# Patient Record
Sex: Female | Born: 1970 | Race: White | Hispanic: No | Marital: Married | Smoking: Former smoker
Health system: Southern US, Community
[De-identification: ages and names within clinical notes are randomized; demographics above are authoritative.]

## PROBLEM LIST (undated history)

## (undated) DIAGNOSIS — N289 Disorder of kidney and ureter, unspecified: Secondary | ICD-10-CM

## (undated) HISTORY — PX: NEPHRECTOMY: SHX65

## (undated) HISTORY — PX: TMJ ARTHROPLASTY: SHX1066

---
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
## 1998-03-01 ENCOUNTER — Inpatient Hospital Stay (HOSPITAL_COMMUNITY): Admission: AD | Admit: 1998-03-01 | Discharge: 1998-03-01 | Payer: Self-pay | Admitting: *Deleted

## 1998-03-16 ENCOUNTER — Inpatient Hospital Stay (HOSPITAL_COMMUNITY): Admission: AD | Admit: 1998-03-16 | Discharge: 1998-03-20 | Payer: Self-pay | Admitting: Obstetrics and Gynecology

## 1998-03-17 ENCOUNTER — Encounter: Payer: Self-pay | Admitting: Obstetrics and Gynecology

## 1999-05-21 ENCOUNTER — Ambulatory Visit (HOSPITAL_COMMUNITY): Admission: RE | Admit: 1999-05-21 | Discharge: 1999-05-21 | Payer: Self-pay | Admitting: *Deleted

## 2000-01-11 ENCOUNTER — Other Ambulatory Visit: Admission: RE | Admit: 2000-01-11 | Discharge: 2000-01-11 | Payer: Self-pay | Admitting: *Deleted

## 2001-01-17 ENCOUNTER — Other Ambulatory Visit: Admission: RE | Admit: 2001-01-17 | Discharge: 2001-01-17 | Payer: Self-pay | Admitting: *Deleted

## 2001-05-16 HISTORY — PX: TUBAL LIGATION: SHX77

## 2001-07-20 ENCOUNTER — Inpatient Hospital Stay (HOSPITAL_COMMUNITY): Admission: AD | Admit: 2001-07-20 | Discharge: 2001-07-22 | Payer: Self-pay | Admitting: *Deleted

## 2001-07-23 ENCOUNTER — Encounter (HOSPITAL_COMMUNITY): Admission: RE | Admit: 2001-07-23 | Discharge: 2001-07-26 | Payer: Self-pay | Admitting: Obstetrics and Gynecology

## 2001-07-31 ENCOUNTER — Inpatient Hospital Stay (HOSPITAL_COMMUNITY): Admission: AD | Admit: 2001-07-31 | Discharge: 2001-08-05 | Payer: Self-pay | Admitting: Obstetrics and Gynecology

## 2001-07-31 ENCOUNTER — Encounter (INDEPENDENT_AMBULATORY_CARE_PROVIDER_SITE_OTHER): Payer: Self-pay | Admitting: *Deleted

## 2001-08-10 ENCOUNTER — Encounter: Payer: Self-pay | Admitting: Urology

## 2001-08-10 ENCOUNTER — Encounter: Admission: RE | Admit: 2001-08-10 | Discharge: 2001-08-10 | Payer: Self-pay | Admitting: Urology

## 2001-09-14 ENCOUNTER — Other Ambulatory Visit: Admission: RE | Admit: 2001-09-14 | Discharge: 2001-09-14 | Payer: Self-pay | Admitting: *Deleted

## 2002-02-28 ENCOUNTER — Ambulatory Visit (HOSPITAL_BASED_OUTPATIENT_CLINIC_OR_DEPARTMENT_OTHER): Admission: RE | Admit: 2002-02-28 | Discharge: 2002-02-28 | Payer: Self-pay | Admitting: Urology

## 2002-02-28 ENCOUNTER — Encounter: Payer: Self-pay | Admitting: Urology

## 2002-05-16 HISTORY — PX: NEPHRECTOMY: SHX65

## 2002-11-05 ENCOUNTER — Other Ambulatory Visit: Admission: RE | Admit: 2002-11-05 | Discharge: 2002-11-05 | Payer: Self-pay | Admitting: *Deleted

## 2006-07-26 ENCOUNTER — Encounter: Admission: RE | Admit: 2006-07-26 | Discharge: 2006-07-26 | Payer: Self-pay | Admitting: Obstetrics and Gynecology

## 2007-09-29 ENCOUNTER — Emergency Department (HOSPITAL_COMMUNITY): Admission: EM | Admit: 2007-09-29 | Discharge: 2007-09-29 | Payer: Self-pay | Admitting: Family Medicine

## 2010-07-26 ENCOUNTER — Other Ambulatory Visit: Payer: Self-pay | Admitting: *Deleted

## 2010-07-26 DIAGNOSIS — Z1231 Encounter for screening mammogram for malignant neoplasm of breast: Secondary | ICD-10-CM

## 2010-08-18 ENCOUNTER — Ambulatory Visit
Admission: RE | Admit: 2010-08-18 | Discharge: 2010-08-18 | Disposition: A | Discharge status: Home / Self Care | Payer: BC Managed Care – PPO | Source: Ambulatory Visit | Attending: *Deleted | Admitting: *Deleted

## 2010-08-18 ENCOUNTER — Other Ambulatory Visit: Payer: Self-pay | Admitting: *Deleted

## 2010-08-18 DIAGNOSIS — Z1231 Encounter for screening mammogram for malignant neoplasm of breast: Secondary | ICD-10-CM

## 2010-10-01 NOTE — Op Note (Signed)
[facility]  Patient:    Donna Glass                       MRN: [id number] Proc. Date: 05/21/99 Adm. Date:  [id number] Attending:  Donne Hazel                           Operative Report  PREOPERATIVE DIAGNOSIS:       Missed abortion.  POSTOPERATIVE DIAGNOSIS:      Missed abortion.  OPERATION:                    D&E.  SURGEON:                      R. Alan Mulder, M.D.  ASSISTANT:  ANESTHESIA:                   Local MAC with local supplementation.  ESTIMATED BLOOD LOSS:         50 cc.  COMPLICATIONS:                None.  FINDINGS:                     Products of conception.  DESCRIPTION OF PROCEDURE:     The patient was taken to the operating room where MAC anesthetic was administered.  The patient was placed on the operating table in he dorsal lithotomy position.  The perineum and vagina were prepped and draped in he usual sterile fashion with Betadine and sterile drapes.  A speculum was placed nd the anterior lip of the cervix was grasped with a single tooth tenaculum.  The  and 9 oclock positions were then insufflated with 10 cc of 1% Xylocaine for a paracervical block.  The cervix was then serially dilated until a #21 Pratt dilator fit easily into the intracervical os.  Next, a #7 curved suction curet was introduced into the intrauterine cavity and with multiple passes, the uterus was since systematically emptied in the usual fashion.  Several passes were made throughout the intrauterine cavity with a moderate amount of products of conception aspirated.  The suction curet was removed and the uterus was sharply curetted with a small serrated curet.  It was noted to be empty after two passes with the small serrated curet.  Two final passes were then made with the suction curet with minimal blood and tissue aspirated.  All vaginal instruments were removed.  The  patient was awakened and taken to the recovery room in good  condition. Blood type was A positive.  There were no perioperative complications. DD:  05/21/99 TD:  05/22/99 Job: [id number]

## 2010-10-01 NOTE — Op Note (Signed)
[facility]  Patient:    Donna Glass, Donna Glass Visit Number: [id number] MRN: [id number]          Service Type: REC Location: [facility] Attending Physician:  Marcelle Overlie Dictated by:   Marcelle Overlie, M.D. Proc. Date: 08/02/01 Admit Date:  07/23/2001 Discharge Date: 07/26/2001                             Operative Report  PREOPERATIVE DIAGNOSES:       1. Intrauterine pregnancy at 38 1/7 weeks.                               2. Pyelonephritis.                               3. Failed vacuum.                               4. Arrest of descent.                               5. Desires permanent sterilization.  POSTOPERATIVE DIAGNOSES:      1. intrauterine pregnancy at 38 1/7 weeks.                               2. Pyelonephritis.                               3. Failed vacuum.                               4. Arrest of descent.                               5. Desires permanent sterilization.  PROCEDURE:                    Primary low transverse cesarean section and bilateral tubal ligation.  SURGEON:                      Marcelle Overlie, M.D.  ANESTHESIA:                   Epidural.  ESTIMATED BLOOD LOSS:         500 cc.  FINDINGS:                     Female infant.  Apgars 8 at one minute, 9 at five minutes.  Weight 7 pounds 7 ounces.  COMPLICATIONS:                None.  PROCEDURE:                    Patient was taken to the operating room.  Her epidural was dosed and found to be adequate.  She was then prepped and draped in the usual sterile fashion.  A Foley catheter was already in the bladder. Using a scalpel a low transverse incision was made, carried down to the  fascia.  Using good hemostasis the fascia was scored in the midline and extended laterally.  A Pfannenstiel incision was then created and developed. The peritoneum was entered bluntly and then the peritoneal incision was then stretched.  A bladder blade was inserted.  The lower  uterine segment was identified.  The bladder flap was created sharply and then digitally and the bladder blade was readjusted.  Using a scalpel a low transverse incision was made in the uterus and extended laterally.  The baby was in cephalic presentation.  Was a female infant and delivered easily weighing 7 pounds 7 ounces.  Apgars 8 at one minute and 9 at five minutes.  Cord was clamped and cut and the baby was handed to the waiting pediatricians.  The placenta was manually removed and noted to be intact and normal.  The uterus was cleared of all clots and debris.  The uterine incision was closed in a single layer using 0 chromic in a continuous running locked stitch.  There was some oozing on the right corner which was hemostatic after a figure-of-eight using 0 chromic suture.  Attention was then turned to the fallopian tubes where bilateral fimbriated ends were identified and a modified Pomeroy bilateral tubal ligation was performed by grasping the mid portion of the tube with the Babcock clamp, tying it off using two sutures using 0 plain gut suture, and cutting the tied off tube with Metzenbaum scissors.  Each tubal segment was sent to pathology for analysis.  The uterus was returned to the abdomen.  All of the tubal segments as well as the uterine incision were inspected and noted to be hemostatic.  Irrigation was performed.  The peritoneum was closed in a single layer using 0 Vicryl in a continuous running stitch.  The fascia was closed using 0 Vicryl in a continuous running stitch starting in each corner and meeting in the midline.  After inspection of the subcutaneous layer the skin was closed with staples.  All sponge, lap, and instrument counts were correct x2.  Patient tolerated procedure well and went to recovery room in stable condition. Dictated by:   Marcelle Overlie, M.D. Attending Physician:  Marcelle Overlie DD:  08/02/01 TD:  08/03/01 Job: [id number]

## 2010-10-01 NOTE — Discharge Summary (Signed)
[facility]  Patient:    Donna Glass, Donna Glass Visit Number: [id number] MRN: [id number]          Service Type: Attending:  Trevor Iha, M.D. Dictated by:   Julio Sicks, N.P. Adm. Date:  07/20/01 Disc. Date: 07/22/01                             Discharge Summary  ADMITTING DIAGNOSIS:             Intrauterine pregnancy at 36 weeks with                                  pyelonephritis.  DISCHARGE DIAGNOSIS:             Intrauterine pregnancy at 36 weeks with                                  resolving pyelonephritis.  REASON FOR ADMISSION:            Please see written H&P.  HOSPITAL COURSE:                 The patient presents to the [facility] with intrauterine pregnancy at 71 weeks estimated gestational age with increasing complaints of low back pain and low-grade fever.  The patient had known urinary tract infection with staphylococcus and pseudomonas on urine culture which was treated with Keflex.  The patient states that she has not slept well for several days due to low back pain.  Fetal heart tones were in the 140s.  Vital signs were stable with temperature noted to be 98.6. Urinalysis did reveal positive leukocytes.  Urine was sent for culture and the patient was admitted for IV antibiotic therapy.  On the following morning, the patient had some improvement in low back pain.  Labs revealed hemoglobin of 9.6, hematocrit of 28.1, and WBC count of 12.6.  IV antibiotics were continued.  Fetal heart tones remained in the 130s.  On the following morning right flank pain was resolved, vital signs were stable, fetal heart tones were in the 140s with accelerations noted.  Urine culture revealed pseudomonas with sensitivities to Rocephin.  The patient was discharged home with daily Rocephin injections.  The patient was to return to the triage for the next 4 days to continue antibiotic therapy.  CONDITION ON DISCHARGE:           Good.  DIET:                            Regular as tolerated.  ACTIVITY:                        Up as tolerated.  FOLLOWUP:                        The patient is to follow up in the office in 1 week for prenatal visit.  DISCHARGE INSTRUCTIONS:          She is to call for temperature greater than 100.5 degrees.  DISCHARGE MEDICATIONS:           1. Prenatal vitamins one p.o. daily.  2. Rocephin 1 g IM x4.                                  3. Ferrous sulfate 325 one p.o. daily.                                  4. Darvocet-N 100 one to two every 4-6 hours                                     p.r.n. pain. Dictated by:   Julio Sicks, N.P. Attending:  Trevor Iha, M.D. DD:  10/18/01 TD:  10/20/01 Job: [id number]

## 2010-10-01 NOTE — Discharge Summary (Signed)
[facility]  Patient:    Donna Glass, Donna Glass Visit Number: [id number] MRN: [id number]          Service Type: OBS Location: 910A [id number] [id number] Attending Physician:  Trevor Iha Dictated by:   Julio Sicks, N.P. Admit Date:  07/31/2001 Discharge Date: 08/05/2001                             Discharge Summary  ADMITTING DIAGNOSES:          1. Intrauterine pregnancy at [redacted] weeks                                  gestation.                               2. Pyelonephritis.                               3. Early labor.  DISCHARGE DIAGNOSES:          1. Low transverse cesarean section of viable                                  female infant.                               2. Failed vacuum delivery.                               3. Arrest of descent.                               4. Desires sterilization.  PROCEDURE:                    1. Primary low transverse cesarean section.                               2. Bilateral tubal ligation.  REASON FOR ADMISSION:         Please see dictated H&P.  HOSPITAL COURSE:              The patient was admitted on July 31, 2001 with history of pyelonephritis in early labor. She had been previously hospitalized on March 7 for pyelonephritis and treated with IV antibiotics. She returned to the hospital in early labor with complaints of bilateral flank pain. IV antibiotics were initiated. The patient progressed along a normal labor curve to complete dilation. She pushed for one and a half hours. Vacuum extractor was applied at the +3 station. No gain was noted and patient was prepped for a low transverse cesarean section. Permanent sterilization was requested. A low transverse cesarean section was performed without complication. A viable female infant weighing 7 pounds 7 ounces with Apgars 8 at one minute and 9 at five minutes was delivered.  On postoperative day #1, the patient had normal return of bowel function.  She also complained of some incisional pain and slight left CVA tenderness. She was tolerating  a regular diet without complaints of nausea and vomiting. Continued IV antibiotics were infusing.  On postoperative day #2, the patient was afebrile, ambulating without difficulty and tolerating a regular diet. Hemoglobin was 7.4, hematocrit 21.8, and WBCs 15.5.  On postoperative day #3, the patient was stable with a hemoglobin of 7.4. She continued to be afebrile. IV antibiotics were continued.  On postoperative day #4, the patient was afebrile, IV antibiotics were discontinued and the patient was discharged home with infant.  CONDITION ON DISCHARGE:       Good.  DIET:                         Regular as tolerated.  ACTIVITY:                     No heavy lifting, no driving x two weeks, no vaginal entry.  DISCHARGE FOLLOWUP:           The patient is to follow up in the office in one to two weeks for an incision check. She is to call for temperature greater than 100 degrees, persistent nausea and vomiting, heavy vaginal bleeding, and/or redness or drainage from the incision site.  DISCHARGE MEDICATIONS:        1. Iron one p.o. q.d.                               2. Prenatal vitamins.                               3. Tylox as directed. Dictated by:   Julio Sicks, N.P. Attending Physician:  Trevor Iha DD:  08/31/01 TD:  09/01/01 Job: [id number]

## 2011-09-09 ENCOUNTER — Other Ambulatory Visit: Payer: Self-pay | Admitting: Obstetrics and Gynecology

## 2011-09-09 DIAGNOSIS — Z1231 Encounter for screening mammogram for malignant neoplasm of breast: Secondary | ICD-10-CM

## 2011-10-05 ENCOUNTER — Ambulatory Visit
Admission: RE | Admit: 2011-10-05 | Discharge: 2011-10-05 | Disposition: A | Discharge status: Home / Self Care | Payer: BC Managed Care – PPO | Source: Ambulatory Visit | Attending: Obstetrics and Gynecology | Admitting: Obstetrics and Gynecology

## 2011-10-05 DIAGNOSIS — Z1231 Encounter for screening mammogram for malignant neoplasm of breast: Secondary | ICD-10-CM

## 2013-06-25 ENCOUNTER — Other Ambulatory Visit: Payer: Self-pay

## 2013-06-25 DIAGNOSIS — Z1231 Encounter for screening mammogram for malignant neoplasm of breast: Secondary | ICD-10-CM

## 2013-06-26 ENCOUNTER — Ambulatory Visit
Admission: RE | Admit: 2013-06-26 | Discharge: 2013-06-26 | Disposition: A | Discharge status: Home / Self Care | Payer: BC Managed Care – PPO | Source: Ambulatory Visit

## 2013-06-26 DIAGNOSIS — Z1231 Encounter for screening mammogram for malignant neoplasm of breast: Secondary | ICD-10-CM

## 2014-08-12 ENCOUNTER — Other Ambulatory Visit: Payer: Self-pay

## 2014-08-12 DIAGNOSIS — Z1231 Encounter for screening mammogram for malignant neoplasm of breast: Secondary | ICD-10-CM

## 2014-08-22 ENCOUNTER — Ambulatory Visit
Admission: RE | Admit: 2014-08-22 | Discharge: 2014-08-22 | Disposition: A | Discharge status: Home / Self Care | Payer: BLUE CROSS/BLUE SHIELD | Source: Ambulatory Visit

## 2014-08-22 ENCOUNTER — Encounter (INDEPENDENT_AMBULATORY_CARE_PROVIDER_SITE_OTHER): Payer: Self-pay

## 2014-08-22 DIAGNOSIS — Z1231 Encounter for screening mammogram for malignant neoplasm of breast: Secondary | ICD-10-CM

## 2015-08-24 ENCOUNTER — Other Ambulatory Visit: Payer: Self-pay

## 2015-08-24 DIAGNOSIS — Z1231 Encounter for screening mammogram for malignant neoplasm of breast: Secondary | ICD-10-CM

## 2015-09-14 ENCOUNTER — Ambulatory Visit
Admission: RE | Admit: 2015-09-14 | Discharge: 2015-09-14 | Disposition: A | Discharge status: Home / Self Care | Payer: BLUE CROSS/BLUE SHIELD | Source: Ambulatory Visit

## 2015-09-14 DIAGNOSIS — Z1231 Encounter for screening mammogram for malignant neoplasm of breast: Secondary | ICD-10-CM

## 2016-09-06 ENCOUNTER — Other Ambulatory Visit: Payer: Self-pay | Admitting: Obstetrics and Gynecology

## 2016-09-06 DIAGNOSIS — Z1231 Encounter for screening mammogram for malignant neoplasm of breast: Secondary | ICD-10-CM

## 2016-10-04 ENCOUNTER — Ambulatory Visit
Admission: RE | Admit: 2016-10-04 | Discharge: 2016-10-04 | Disposition: A | Discharge status: Home / Self Care | Payer: BLUE CROSS/BLUE SHIELD | Source: Ambulatory Visit | Attending: Obstetrics and Gynecology | Admitting: Obstetrics and Gynecology

## 2016-10-04 DIAGNOSIS — Z1231 Encounter for screening mammogram for malignant neoplasm of breast: Secondary | ICD-10-CM

## 2017-11-17 ENCOUNTER — Other Ambulatory Visit: Payer: Self-pay | Admitting: Obstetrics and Gynecology

## 2017-11-17 DIAGNOSIS — Z1231 Encounter for screening mammogram for malignant neoplasm of breast: Secondary | ICD-10-CM

## 2017-12-08 ENCOUNTER — Ambulatory Visit
Admission: RE | Admit: 2017-12-08 | Discharge: 2017-12-08 | Disposition: A | Discharge status: Home / Self Care | Payer: BLUE CROSS/BLUE SHIELD | Source: Ambulatory Visit | Attending: Obstetrics and Gynecology | Admitting: Obstetrics and Gynecology

## 2017-12-08 DIAGNOSIS — Z1231 Encounter for screening mammogram for malignant neoplasm of breast: Secondary | ICD-10-CM

## 2018-10-08 ENCOUNTER — Inpatient Hospital Stay (HOSPITAL_COMMUNITY)
Admission: EM | Admit: 2018-10-08 | Discharge: 2018-10-09 | DRG: 419 | Disposition: A | Discharge status: Home / Self Care | Payer: BLUE CROSS/BLUE SHIELD | Attending: General Surgery | Admitting: General Surgery

## 2018-10-08 ENCOUNTER — Encounter (HOSPITAL_COMMUNITY): Payer: Self-pay

## 2018-10-08 ENCOUNTER — Emergency Department (HOSPITAL_COMMUNITY): Payer: BLUE CROSS/BLUE SHIELD

## 2018-10-08 ENCOUNTER — Other Ambulatory Visit: Payer: Self-pay

## 2018-10-08 DIAGNOSIS — Z7982 Long term (current) use of aspirin: Secondary | ICD-10-CM | POA: Diagnosis not present

## 2018-10-08 DIAGNOSIS — Z79890 Hormone replacement therapy (postmenopausal): Secondary | ICD-10-CM | POA: Diagnosis not present

## 2018-10-08 DIAGNOSIS — Z87891 Personal history of nicotine dependence: Secondary | ICD-10-CM

## 2018-10-08 DIAGNOSIS — Z1159 Encounter for screening for other viral diseases: Secondary | ICD-10-CM

## 2018-10-08 DIAGNOSIS — K8000 Calculus of gallbladder with acute cholecystitis without obstruction: Principal | ICD-10-CM | POA: Diagnosis present

## 2018-10-08 DIAGNOSIS — Z79899 Other long term (current) drug therapy: Secondary | ICD-10-CM | POA: Diagnosis not present

## 2018-10-08 DIAGNOSIS — Z888 Allergy status to other drugs, medicaments and biological substances status: Secondary | ICD-10-CM

## 2018-10-08 DIAGNOSIS — Z882 Allergy status to sulfonamides status: Secondary | ICD-10-CM

## 2018-10-08 DIAGNOSIS — Z885 Allergy status to narcotic agent status: Secondary | ICD-10-CM

## 2018-10-08 DIAGNOSIS — Z905 Acquired absence of kidney: Secondary | ICD-10-CM | POA: Diagnosis not present

## 2018-10-08 DIAGNOSIS — K819 Cholecystitis, unspecified: Secondary | ICD-10-CM

## 2018-10-08 DIAGNOSIS — Z880 Allergy status to penicillin: Secondary | ICD-10-CM

## 2018-10-08 DIAGNOSIS — R079 Chest pain, unspecified: Secondary | ICD-10-CM | POA: Diagnosis present

## 2018-10-08 HISTORY — DX: Cholecystitis, unspecified: K81.9

## 2018-10-08 LAB — CBC WITH DIFFERENTIAL/PLATELET
Abs Immature Granulocytes: 0.06 10*3/uL (ref 0.00–0.07)
Basophils Absolute: 0.1 10*3/uL (ref 0.0–0.1)
Basophils Relative: 1 %
Eosinophils Absolute: 0.1 10*3/uL (ref 0.0–0.5)
Eosinophils Relative: 1 %
HCT: 41.3 % (ref 36.0–46.0)
Hemoglobin: 13.5 g/dL (ref 12.0–15.0)
Immature Granulocytes: 0 %
Lymphocytes Relative: 13 %
Lymphs Abs: 2.2 10*3/uL (ref 0.7–4.0)
MCH: 31 pg (ref 26.0–34.0)
MCHC: 32.7 g/dL (ref 30.0–36.0)
MCV: 94.7 fL (ref 80.0–100.0)
Monocytes Absolute: 0.6 10*3/uL (ref 0.1–1.0)
Monocytes Relative: 4 %
Neutro Abs: 13.6 10*3/uL — ABNORMAL HIGH (ref 1.7–7.7)
Neutrophils Relative %: 81 %
Platelets: 310 10*3/uL (ref 150–400)
RBC: 4.36 MIL/uL (ref 3.87–5.11)
RDW: 12.7 % (ref 11.5–15.5)
WBC: 16.6 10*3/uL — ABNORMAL HIGH (ref 4.0–10.5)
nRBC: 0 % (ref 0.0–0.2)

## 2018-10-08 LAB — COMPREHENSIVE METABOLIC PANEL
ALT: 22 U/L (ref 0–44)
AST: 26 U/L (ref 15–41)
Albumin: 4.3 g/dL (ref 3.5–5.0)
Alkaline Phosphatase: 101 U/L (ref 38–126)
Anion gap: 16 — ABNORMAL HIGH (ref 5–15)
BUN: 17 mg/dL (ref 6–20)
CO2: 18 mmol/L — ABNORMAL LOW (ref 22–32)
Calcium: 9.8 mg/dL (ref 8.9–10.3)
Chloride: 107 mmol/L (ref 98–111)
Creatinine, Ser: 1.08 mg/dL — ABNORMAL HIGH (ref 0.44–1.00)
GFR calc Af Amer: >60 mL/min (ref >60)
GFR calc non Af Amer: >60 mL/min (ref >60)
Glucose, Bld: 108 mg/dL — ABNORMAL HIGH (ref 70–99)
Potassium: 4 mmol/L (ref 3.5–5.1)
Sodium: 141 mmol/L (ref 135–145)
Total Bilirubin: 0.4 mg/dL (ref 0.3–1.2)
Total Protein: 7.7 g/dL (ref 6.5–8.1)

## 2018-10-08 LAB — SURGICAL PCR SCREEN
MRSA, PCR: NEGATIVE
Staphylococcus aureus: NEGATIVE

## 2018-10-08 LAB — D-DIMER, QUANTITATIVE (NOT AT ARMC): D-Dimer, Quant: 0.33 ug/mL-FEU (ref 0.00–0.50)

## 2018-10-08 LAB — TROPONIN I: Troponin I: <0.03 ng/mL (ref <0.03)

## 2018-10-08 LAB — SARS CORONAVIRUS 2 BY RT PCR (HOSPITAL ORDER, PERFORMED IN ~~LOC~~ HOSPITAL LAB): SARS Coronavirus 2: NEGATIVE

## 2018-10-08 LAB — LIPASE, BLOOD: Lipase: 27 U/L (ref 11–51)

## 2018-10-08 MED ORDER — DIPHENHYDRAMINE HCL 50 MG/ML IJ SOLN: 25.0000 mg | Freq: Four times a day (QID) | INTRAMUSCULAR | Status: DC | PRN

## 2018-10-08 MED ORDER — ACETAMINOPHEN 650 MG RE SUPP: 650.0000 mg | Freq: Four times a day (QID) | RECTAL | Status: DC | PRN

## 2018-10-08 MED ORDER — DIPHENHYDRAMINE HCL 25 MG PO CAPS: 25.0000 mg | ORAL_CAPSULE | Freq: Four times a day (QID) | ORAL | Status: DC | PRN

## 2018-10-08 MED ORDER — ENOXAPARIN SODIUM 40 MG/0.4ML ~~LOC~~ SOLN
40.0000 mg | SUBCUTANEOUS | Status: DC
  Administered 2018-10-08: 40 mg via SUBCUTANEOUS
  Filled 2018-10-08: qty 0.4

## 2018-10-08 MED ORDER — MORPHINE SULFATE (PF) 4 MG/ML IV SOLN
INTRAVENOUS | Status: AC
  Filled 2018-10-08: qty 1

## 2018-10-08 MED ORDER — HYDROMORPHONE HCL 1 MG/ML IJ SOLN
1.0000 mg | Freq: Once | INTRAMUSCULAR | Status: AC
  Administered 2018-10-08: 1 mg via INTRAVENOUS
  Filled 2018-10-08: qty 1

## 2018-10-08 MED ORDER — ALUM & MAG HYDROXIDE-SIMETH 200-200-20 MG/5ML PO SUSP: 30.0000 mL | Freq: Once | ORAL | Status: DC

## 2018-10-08 MED ORDER — ONDANSETRON HCL 4 MG/2ML IJ SOLN: 4.0000 mg | Freq: Four times a day (QID) | INTRAMUSCULAR | Status: DC | PRN

## 2018-10-08 MED ORDER — CIPROFLOXACIN IN D5W 400 MG/200ML IV SOLN
400.0000 mg | Freq: Once | INTRAVENOUS | Status: AC
  Administered 2018-10-08: 400 mg via INTRAVENOUS
  Filled 2018-10-08: qty 200

## 2018-10-08 MED ORDER — OXYCODONE HCL 5 MG PO TABS
5.0000 mg | ORAL_TABLET | ORAL | Status: DC | PRN
  Administered 2018-10-09: 5 mg via ORAL
  Filled 2018-10-08: qty 1

## 2018-10-08 MED ORDER — CIPROFLOXACIN IN D5W 400 MG/200ML IV SOLN
400.0000 mg | Freq: Two times a day (BID) | INTRAVENOUS | Status: DC
  Administered 2018-10-09: 03:00:00 400 mg via INTRAVENOUS
  Filled 2018-10-08 (×2): qty 200

## 2018-10-08 MED ORDER — LIDOCAINE VISCOUS HCL 2 % MT SOLN: 15.0000 mL | Freq: Once | OROMUCOSAL | Status: DC

## 2018-10-08 MED ORDER — ACETAMINOPHEN 325 MG PO TABS
650.0000 mg | ORAL_TABLET | Freq: Four times a day (QID) | ORAL | Status: DC | PRN
  Administered 2018-10-09 (×2): 650 mg via ORAL
  Filled 2018-10-08 (×2): qty 2

## 2018-10-08 MED ORDER — MORPHINE SULFATE (PF) 2 MG/ML IV SOLN
2.0000 mg | INTRAVENOUS | Status: DC | PRN
  Administered 2018-10-08 – 2018-10-09 (×3): 2 mg via INTRAVENOUS
  Filled 2018-10-08 (×3): qty 1

## 2018-10-08 MED ORDER — ONDANSETRON 4 MG PO TBDP: 4.0000 mg | ORAL_TABLET | Freq: Four times a day (QID) | ORAL | Status: DC | PRN

## 2018-10-08 MED ORDER — DEXTROSE-NACL 5-0.45 % IV SOLN
INTRAVENOUS | Status: DC
  Administered 2018-10-08: 18:00:00 via INTRAVENOUS

## 2018-10-08 MED ORDER — MORPHINE SULFATE (PF) 4 MG/ML IV SOLN
4.0000 mg | Freq: Once | INTRAVENOUS | Status: AC
  Administered 2018-10-08: 13:00:00 4 mg via INTRAVENOUS
  Filled 2018-10-08: qty 1

## 2018-10-08 MED ORDER — FAMOTIDINE IN NACL 20-0.9 MG/50ML-% IV SOLN
20.0000 mg | Freq: Once | INTRAVENOUS | Status: AC
  Administered 2018-10-08: 13:00:00 20 mg via INTRAVENOUS
  Filled 2018-10-08: qty 50

## 2018-10-08 MED ORDER — METOPROLOL TARTRATE 5 MG/5ML IV SOLN: 5.0000 mg | Freq: Four times a day (QID) | INTRAVENOUS | Status: DC | PRN

## 2018-10-08 NOTE — ED Triage Notes (Signed)
Lside CP/ midsternal radiating into mid back, woke her from sleep.  Has improved and worsened without complete resolve.  N/V starting half hour later.  Pt guarding at mid chest with tachypnea.  Alert and oriented.

## 2018-10-08 NOTE — ED Notes (Signed)
ED TO INPATIENT HANDOFF REPORT  ED Nurse Name and Phone #:  Britta Mccreedy   S Name/Age/Gender Donna Glass 48 y.o. female Room/Bed: 030C/030C  Code Status   Code Status: Full Code  Home/SNF/Other Home Patient oriented to: self, place, time and situation Is this baseline? Yes   Triage Complete: Triage complete  Chief Complaint CP  Triage Note Lside CP/ midsternal radiating into mid back, woke her from sleep.  Has improved and worsened without complete resolve.  N/V starting half hour later.  Pt guarding at mid chest with tachypnea.  Alert and oriented.    Allergies Allergies  Allergen Reactions  . Trimethoprim Hives and Rash  . Amoxicillin Hives  . Sulfamethoxazole Hives  . Codeine Palpitations    Level of Care/Admitting Diagnosis ED Disposition    ED Disposition Condition Comment   Admit  Hospital Area: [facility] [[id number]]  Level of Care: Med-Surg [16]  Covid Evaluation: Screening Protocol (No Symptoms)  Diagnosis: Acute calculous cholecystitis [161096]  Admitting Physician: CCS, MD [[id number]]  Attending Physician: CCS, MD [[id number]]  Estimated length of stay: past midnight tomorrow  Certification:: I certify this patient will need inpatient services for at least 2 midnights  PT Class (Do Not Modify): Inpatient [101]  PT Acc Code (Do Not Modify): Private [1]       B Medical/Surgery History No past medical history on file. Past Surgical History:  Procedure Laterality Date  . NEPHRECTOMY       A IV Location/Drains/Wounds Patient Lines/Drains/Airways Status   Active Line/Drains/Airways    Name:   Placement date:   Placement time:   Site:   Days:   Peripheral IV 10/08/18 Right Antecubital   10/08/18    1055    Antecubital   less than 1          Intake/Output Last 24 hours  Intake/Output Summary (Last 24 hours) at 10/08/2018 1619 Last data filed at 10/08/2018 1618 Gross per 24 hour  Intake 350 ml  Output -  Net 350 ml     Labs/Imaging Results for orders placed or performed during the hospital encounter of 10/08/18 (from the past 48 hour(s))  Troponin I - Once     Status: None   Collection Time: 10/08/18 11:05 AM  Result Value Ref Range   Troponin I <0.03 <0.03 ng/mL    Comment: Performed at [facility], [address] [place], [place] [address]  Comprehensive metabolic panel     Status: Abnormal   Collection Time: 10/08/18 11:05 AM  Result Value Ref Range   Sodium 141 135 - 145 mmol/L   Potassium 4.0 3.5 - 5.1 mmol/L   Chloride 107 98 - 111 mmol/L   CO2 18 (L) 22 - 32 mmol/L   Glucose, Bld 108 (H) 70 - 99 mg/dL   BUN 17 6 - 20 mg/dL   Creatinine, Ser 9.81 (H) 0.44 - 1.00 mg/dL   Calcium 9.8 8.9 - 19.1 mg/dL   Total Protein 7.7 6.5 - 8.1 g/dL   Albumin 4.3 3.5 - 5.0 g/dL   AST 26 15 - 41 U/L   ALT 22 0 - 44 U/L   Alkaline Phosphatase 101 38 - 126 U/L   Total Bilirubin 0.4 0.3 - 1.2 mg/dL   GFR calc non Af Amer >60 >60 mL/min   GFR calc Af Amer >60 >60 mL/min   Anion gap 16 (H) 5 - 15    Comment: Performed at [facility], [address] [place], [place] [address]  CBC with Differential     Status: Abnormal   Collection Time: 10/08/18 11:05 AM  Result Value Ref Range   WBC 16.6 (H) 4.0 - 10.5 K/uL   RBC 4.36 3.87 - 5.11 MIL/uL   Hemoglobin 13.5 12.0 - 15.0 g/dL   HCT 60.4 54.0 - 98.1 %   MCV 94.7 80.0 - 100.0 fL   MCH 31.0 26.0 - 34.0 pg   MCHC 32.7 30.0 - 36.0 g/dL   RDW 19.1 47.8 - 29.5 %   Platelets 310 150 - 400 K/uL   nRBC 0.0 0.0 - 0.2 %   Neutrophils Relative % 81 %   Neutro Abs 13.6 (H) 1.7 - 7.7 K/uL   Lymphocytes Relative 13 %   Lymphs Abs 2.2 0.7 - 4.0 K/uL   Monocytes Relative 4 %   Monocytes Absolute 0.6 0.1 - 1.0 K/uL   Eosinophils Relative 1 %   Eosinophils Absolute 0.1 0.0 - 0.5 K/uL   Basophils Relative 1 %   Basophils Absolute 0.1 0.0 - 0.1 K/uL   Immature Granulocytes 0 %   Abs Immature Granulocytes 0.06 0.00 - 0.07 K/uL    Comment: Performed  at [facility], [address], [place], [place] [address]  D-dimer, quantitative     Status: None   Collection Time: 10/08/18 11:05 AM  Result Value Ref Range   D-Dimer, Quant 0.33 0.00 - 0.50 ug/mL-FEU    Comment: (NOTE) At the manufacturer cut-off of 0.50 ug/mL FEU, this assay has been documented to exclude PE with a sensitivity and negative predictive value of 97 to 99%.  At this time, this assay has not been approved by the FDA to exclude DVT/VTE. Results should be correlated with clinical presentation. Performed at [facility], [address], [place], [place] [address]   Lipase, blood     Status: None   Collection Time: 10/08/18 12:30 PM  Result Value Ref Range   Lipase 27 11 - 51 U/L    Comment: Performed at [facility], [address] [place], [place] [address]  SARS Coronavirus 2 (CEPHEID - Performed in [facility] hospital lab), Hosp Order     Status: None   Collection Time: 10/08/18  3:01 PM  Result Value Ref Range   SARS Coronavirus 2 NEGATIVE NEGATIVE    Comment: (NOTE) If result is NEGATIVE SARS-CoV-2 target nucleic acids are NOT DETECTED. The SARS-CoV-2 RNA is generally detectable in upper and lower  respiratory specimens during the acute phase of infection. The lowest  concentration of SARS-CoV-2 viral copies this assay can detect is 250  copies / mL. A negative result does not preclude SARS-CoV-2 infection  and should not be used as the sole basis for treatment or other  patient management decisions.  A negative result may occur with  improper specimen collection / handling, submission of specimen other  than nasopharyngeal swab, presence of viral mutation(s) within the  areas targeted by this assay, and inadequate number of viral copies  (<250 copies / mL). A negative result must be combined with clinical  observations, patient history, and epidemiological information. If result is POSITIVE SARS-CoV-2 target nucleic acids are  DETECTED. The SARS-CoV-2 RNA is generally detectable in upper and lower  respiratory specimens dur ing the acute phase of infection.  Positive  results are indicative of active infection with SARS-CoV-2.  Clinical  correlation with patient history and other diagnostic information is  necessary to  determine patient infection status.  Positive results do  not rule out bacterial infection or co-infection with other viruses. If result is PRESUMPTIVE POSTIVE SARS-CoV-2 nucleic acids MAY BE PRESENT.   A presumptive positive result was obtained on the submitted specimen  and confirmed on repeat testing.  While 2019 novel coronavirus  (SARS-CoV-2) nucleic acids may be present in the submitted sample  additional confirmatory testing may be necessary for epidemiological  and / or clinical management purposes  to differentiate between  SARS-CoV-2 and other Sarbecovirus currently known to infect humans.  If clinically indicated additional testing with an alternate test  methodology [phone] is advised. The SARS-CoV-2 RNA is generally  detectable in upper and lower respiratory sp ecimens during the acute  phase of infection. The expected result is Negative. Fact Sheet for Patients:  BoilerBrush.com.cyhttps://www.fda.gov/media/136312/download Fact Sheet for Healthcare Providers: https://pope.com/https://www.fda.gov/media/136313/download This test is not yet approved or cleared by the [place] FDA and has been authorized for detection and/or diagnosis of SARS-CoV-2 by FDA under an Emergency Use Authorization (EUA).  This EUA will remain in effect (meaning this test can be used) for the duration of the COVID-19 declaration under Section 564(b)(1) of the Act, 21 U.S.C. section 360bbb-3(b)(1), unless the authorization is terminated or revoked sooner. Performed at [facility], [address] [place], [place] [address]    Dg Chest 2 View  Result Date: 10/08/2018 CLINICAL DATA:  Epigastric pain, vomiting. EXAM: CHEST -  2 VIEW COMPARISON:  Chest x-ray dated 03/20/2015. FINDINGS: The heart size and mediastinal contours are within normal limits. Both lungs are clear. The visualized skeletal structures are unremarkable. IMPRESSION: No active cardiopulmonary disease. Electronically Signed   By: Bary RichardStan  Maynard M.D.   On: 10/08/2018 12:21   [place] Abdomen Limited  Result Date: 10/08/2018 CLINICAL DATA:  Chest pain radiating to the back EXAM: ULTRASOUND ABDOMEN LIMITED RIGHT UPPER QUADRANT COMPARISON:  None. FINDINGS: Gallbladder: Cholelithiasis with the largest gallstone measuring 13 mm. Gallbladder sludge. Gallbladder wall thickening measuring 9 mm. Trace pericholecystic fluid. Positive sonographic Murphy sign. Common bile duct: Diameter: 4.6 mm Liver: No focal lesion identified. Increased hepatic parenchymal echogenicity. Portal vein is patent on color Doppler imaging with normal direction of blood flow towards the liver. IMPRESSION: Cholelithiasis with findings most concerning for acute cholecystitis. Electronically Signed   By: Elige KoHetal  Patel   On: 10/08/2018 12:21    Pending Labs Unresulted Labs (From admission, onward)    Start     Ordered   10/15/18 0500  Creatinine, serum  (enoxaparin (LOVENOX)    CrCl >/= 30 ml/min)  Weekly,   R    Comments:  while on enoxaparin therapy    10/08/18 1545   10/09/18 0500  Comprehensive metabolic panel  Daily,   R     10/08/18 1545   10/09/18 0500  CBC  Daily,   R     10/08/18 1545   10/08/18 1544  CBC  (enoxaparin (LOVENOX)    CrCl >/= 30 ml/min)  Once,   R    Comments:  Baseline for enoxaparin therapy IF NOT ALREADY DRAWN.  Notify MD if PLT < 100 K.    10/08/18 1545   10/08/18 1544  Creatinine, serum  (enoxaparin (LOVENOX)    CrCl >/= 30 ml/min)  Once,   R    Comments:  Baseline for enoxaparin therapy IF NOT ALREADY DRAWN.    10/08/18 1545   10/08/18 1544  HIV antibody (Routine Testing)  Once,   R  10/08/18 1545          Vitals/Pain Today's Vitals   10/08/18 1315  10/08/18 1330 10/08/18 1345 10/08/18 1400  BP: 133/80 121/78 119/76 (!) 121/100  Pulse: 83  84 83  Resp: 12 20 11 18   Temp:      TempSrc:      SpO2: 100%  100% 100%  Weight:      Height:      PainSc:        Isolation Precautions No active isolations  Medications Medications  morphine 4 MG/ML injection (has no administration in time range)  alum & mag hydroxide-simeth (MAALOX/MYLANTA) 200-200-20 MG/5ML suspension 30 mL (30 mLs Oral Not Given 10/08/18 1445)    And  lidocaine (XYLOCAINE) 2 % viscous mouth solution 15 mL (15 mLs Oral Not Given 10/08/18 1446)  enoxaparin (LOVENOX) injection 40 mg (has no administration in time range)  dextrose 5 %-0.45 % sodium chloride infusion (has no administration in time range)  acetaminophen (TYLENOL) tablet 650 mg (has no administration in time range)    Or  acetaminophen (TYLENOL) suppository 650 mg (has no administration in time range)  oxyCODONE (Oxy IR/ROXICODONE) immediate release tablet 5 mg (has no administration in time range)  morphine 2 MG/ML injection 2 mg (has no administration in time range)  ondansetron (ZOFRAN-ODT) disintegrating tablet 4 mg (has no administration in time range)    Or  ondansetron (ZOFRAN) injection 4 mg (has no administration in time range)  metoprolol tartrate (LOPRESSOR) injection 5 mg (has no administration in time range)  ciprofloxacin (CIPRO) IVPB 400 mg (has no administration in time range)  diphenhydrAMINE (BENADRYL) capsule 25 mg (has no administration in time range)    Or  diphenhydrAMINE (BENADRYL) injection 25 mg (has no administration in time range)  famotidine (PEPCID) IVPB 20 mg premix (0 mg Intravenous Stopped 10/08/18 1327)  morphine 4 MG/ML injection 4 mg (4 mg Intravenous Given 10/08/18 1302)  ciprofloxacin (CIPRO) IVPB 400 mg (0 mg Intravenous Stopped 10/08/18 1618)  HYDROmorphone (DILAUDID) injection 1 mg (1 mg Intravenous Given 10/08/18 1457)    Mobility walks Low fall risk   Focused  Assessments Cardiac Assessment Handoff:  Cardiac Rhythm: Normal sinus rhythm Lab Results  Component Value Date   TROPONINI <0.03 10/08/2018   Lab Results  Component Value Date   DDIMER 0.33 10/08/2018   Does the Patient currently have chest pain? No     R Recommendations: See Admitting Provider Note  Report given to:   Additional Notes:

## 2018-10-08 NOTE — H&P (Signed)
Reason for Consult: epigastric pain Referring Physician: Josi Roediger is an 48 y.o. female.  HPI: 48 yo female with 1 day of epigastric pain. She woke up with the pain. It is intense. It radiates to her back. She has never had a pain like this before. She has been vomiting multiple times today. She denies diarrhea or constipation. She denies odd foods yesterday.  No past medical history on file.  Past Surgical History:  Procedure Laterality Date  . NEPHRECTOMY      Family History  Problem Relation Age of Onset  . Breast cancer Neg Hx     Social History:  reports that she has quit smoking. She has never used smokeless tobacco. She reports current alcohol use. She reports that she does not use drugs.  Allergies:  Allergies  Allergen Reactions  . Trimethoprim Hives and Rash  . Amoxicillin Hives  . Sulfamethoxazole Hives  . Codeine Palpitations    Medications: I have reviewed the patient's current medications.  Results for orders placed or performed during the hospital encounter of 10/08/18 (from the past 48 hour(s))  Troponin I - Once     Status: None   Collection Time: 10/08/18 11:05 AM  Result Value Ref Range   Troponin I <0.03 <0.03 ng/mL    Comment: Performed at [facility], [address] [place], [place] [address]  Comprehensive metabolic panel     Status: Abnormal   Collection Time: 10/08/18 11:05 AM  Result Value Ref Range   Sodium 141 135 - 145 mmol/L   Potassium 4.0 3.5 - 5.1 mmol/L   Chloride 107 98 - 111 mmol/L   CO2 18 (L) 22 - 32 mmol/L   Glucose, Bld 108 (H) 70 - 99 mg/dL   BUN 17 6 - 20 mg/dL   Creatinine, Ser 1.47 (H) 0.44 - 1.00 mg/dL   Calcium 9.8 8.9 - 82.9 mg/dL   Total Protein 7.7 6.5 - 8.1 g/dL   Albumin 4.3 3.5 - 5.0 g/dL   AST 26 15 - 41 U/L   ALT 22 0 - 44 U/L   Alkaline Phosphatase 101 38 - 126 U/L   Total Bilirubin 0.4 0.3 - 1.2 mg/dL   GFR calc non Af Amer >60 >60 mL/min   GFR calc Af Amer >60 >60 mL/min    Anion gap 16 (H) 5 - 15    Comment: Performed at [facility], [address] [place], [place] [address]  CBC with Differential     Status: Abnormal   Collection Time: 10/08/18 11:05 AM  Result Value Ref Range   WBC 16.6 (H) 4.0 - 10.5 K/uL   RBC 4.36 3.87 - 5.11 MIL/uL   Hemoglobin 13.5 12.0 - 15.0 g/dL   HCT 08.6 57.8 - 46.9 %   MCV 94.7 80.0 - 100.0 fL   MCH 31.0 26.0 - 34.0 pg   MCHC 32.7 30.0 - 36.0 g/dL   RDW 62.9 52.8 - 41.3 %   Platelets 310 150 - 400 K/uL   nRBC 0.0 0.0 - 0.2 %   Neutrophils Relative % 81 %   Neutro Abs 13.6 (H) 1.7 - 7.7 K/uL   Lymphocytes Relative 13 %   Lymphs Abs 2.2 0.7 - 4.0 K/uL   Monocytes Relative 4 %   Monocytes Absolute 0.6 0.1 - 1.0 K/uL   Eosinophils Relative 1 %   Eosinophils Absolute 0.1 0.0 - 0.5 K/uL   Basophils Relative 1 %  Basophils Absolute 0.1 0.0 - 0.1 K/uL   Immature Granulocytes 0 %   Abs Immature Granulocytes 0.06 0.00 - 0.07 K/uL    Comment: Performed at [facility], [address] [place], [place] [address]  D-dimer, quantitative     Status: None   Collection Time: 10/08/18 11:05 AM  Result Value Ref Range   D-Dimer, Quant 0.33 0.00 - 0.50 ug/mL-FEU    Comment: (NOTE) At the manufacturer cut-off of 0.50 ug/mL FEU, this assay has been documented to exclude PE with a sensitivity and negative predictive value of 97 to 99%.  At this time, this assay has not been approved by the FDA to exclude DVT/VTE. Results should be correlated with clinical presentation. Performed at [facility], [address] [place], [place] [address]   Lipase, blood     Status: None   Collection Time: 10/08/18 12:30 PM  Result Value Ref Range   Lipase 27 11 - 51 U/L    Comment: Performed at [facility], [address] [place], [place] [address]    Dg Chest 2 View  Result Date: 10/08/2018 CLINICAL DATA:  Epigastric pain, vomiting. EXAM: CHEST - 2 VIEW COMPARISON:  Chest x-ray dated 03/20/2015. FINDINGS: The  heart size and mediastinal contours are within normal limits. Both lungs are clear. The visualized skeletal structures are unremarkable. IMPRESSION: No active cardiopulmonary disease. Electronically Signed   By: Bary RichardStan  Maynard M.D.   On: 10/08/2018 12:21   [place] Abdomen Limited  Result Date: 10/08/2018 CLINICAL DATA:  Chest pain radiating to the back EXAM: ULTRASOUND ABDOMEN LIMITED RIGHT UPPER QUADRANT COMPARISON:  None. FINDINGS: Gallbladder: Cholelithiasis with the largest gallstone measuring 13 mm. Gallbladder sludge. Gallbladder wall thickening measuring 9 mm. Trace pericholecystic fluid. Positive sonographic Murphy sign. Common bile duct: Diameter: 4.6 mm Liver: No focal lesion identified. Increased hepatic parenchymal echogenicity. Portal vein is patent on color Doppler imaging with normal direction of blood flow towards the liver. IMPRESSION: Cholelithiasis with findings most concerning for acute cholecystitis. Electronically Signed   By: Elige KoHetal  Patel   On: 10/08/2018 12:21    Review of Systems  Constitutional: Negative for chills and fever.  HENT: Negative for hearing loss.   Eyes: Negative for blurred vision and double vision.  Respiratory: Negative for cough and hemoptysis.   Cardiovascular: Negative for chest pain and palpitations.  Gastrointestinal: Positive for abdominal pain, nausea and vomiting.  Genitourinary: Negative for dysuria and urgency.  Musculoskeletal: Negative for myalgias and neck pain.  Skin: Negative for itching and rash.  Neurological: Negative for dizziness, tingling and headaches.  Endo/Heme/Allergies: Does not bruise/bleed easily.  Psychiatric/Behavioral: Negative for depression and suicidal ideas.   Blood pressure 119/76, pulse 84, temperature 98.1 F (36.7 C), temperature source Oral, resp. rate 11, height 5\' 6"  (1.676 m), weight 78.5 kg, last menstrual period 05/27/2013, SpO2 100 %. Physical Exam  Vitals reviewed. Constitutional: She is oriented to person,  place, and time. She appears well-developed and well-nourished.  HENT:  Head: Normocephalic and atraumatic.  Eyes: Pupils are equal, round, and reactive to light. Conjunctivae and EOM are normal.  Neck: Normal range of motion. Neck supple.  Cardiovascular: Normal rate and regular rhythm.  Respiratory: Effort normal and breath sounds normal.  GI: Soft. Bowel sounds are normal. She exhibits no distension. There is abdominal tenderness in the right upper quadrant and epigastric area. There is guarding. There is no rigidity. No hernia. Hernia confirmed negative in the ventral area.  Musculoskeletal: Normal range  of motion.  Neurological: She is alert and oriented to person, place, and time.  Skin: Skin is warm and dry.  Psychiatric: She has a normal mood and affect. Her behavior is normal.      Assessment/Plan: 48 yo female with acute cholecystitis -admit to surgery -IV abx -plan for lap chole in am We discussed the etiology of her pain, we discussed treatment options and recommended surgery. We discussed details of surgery including general anesthesia, laparoscopic approach, identification of cystic duct and common bile duct. Ligation of cystic duct and cystic artery. Possible need for intraoperative cholangiogram or open procedure. Possible risks of common bile duct injury, liver injury, cystic duct leak, bleeding, infection, post-cholecystectomy syndrome. The patient showed good understanding and all questions were answered   [place]anch  10/08/2018, 3:39 PM

## 2018-10-08 NOTE — ED Notes (Signed)
Patient transported to X-ray- ultrasound with monitor.

## 2018-10-08 NOTE — ED Provider Notes (Signed)
[facility] EMERGENCY DEPARTMENT Provider Note   CSN: [id number] Arrival date & time: 10/08/18  1040    History   Chief Complaint No chief complaint on file.   HPI Donna Glass is a 48 y.o. female with history of CKD status post left nephrectomy presents for evaluation of acute onset, constant chest pains beginning at around 6 AM this morning.  She reports that she awoke with severe discomfort to the midline chest radiating straight to the back (though she motions to the epigastric region when describing where her pain originates). She describes it as a severe aching sensation.  It worsens breathing out and certain positions sitting upright and walking improve her pain.  Has had several episodes of nonbloody nonbilious emesis.  She reports mild shortness of breath which she attributes to her discomfort.  Denies fever, cough, syncope, leg swelling, abdominal pain, diarrhea, constipation, or urinary symptoms.  She took an over-the-counter nausea medication and Pepto-Bismol without relief of her symptoms.  No recent travel or surgeries, no leg swelling, no hemoptysis, no prior history of DVT or PE.  She is on hormone replacement therapy.  She is a former smoker, quit December 2019.  Denies excessive alcohol intake or recreational drug use.     The history is provided by the patient.    No past medical history on file.  Patient Active Problem List   Diagnosis Date Noted   Acute calculous cholecystitis 10/08/2018    Past Surgical History:  Procedure Laterality Date   NEPHRECTOMY       OB History   No obstetric history on file.      Home Medications    Prior to Admission medications   Medication Sig Start Date End Date Taking? Authorizing Provider  acetaminophen (TYLENOL) 500 MG tablet Take 1,000 mg by mouth every 6 (six) hours as needed for mild pain.   Yes [provider]  anti-nausea (EMETROL) solution Take 10 mLs by mouth every 15 (fifteen)  minutes as needed for nausea or vomiting.   Yes [provider]  aspirin 325 MG tablet Take 650 mg by mouth once.   Yes [provider]  bismuth subsalicylate (PEPTO BISMOL) 262 MG chewable tablet Chew 524 mg by mouth as needed for indigestion.   Yes [provider]  buPROPion (WELLBUTRIN XL) 300 MG 24 hr tablet Take 300 mg by mouth daily. 07/30/18  Yes [provider]  diphenhydrAMINE (BENADRYL) 25 MG tablet Take 25 mg by mouth as needed for allergies.   Yes [provider]  DUAVEE 0.45-20 MG TABS Take 1 tablet by mouth daily. 09/05/18  Yes [provider]  Phentermine HCl 8 MG TABS Take 8 mg by mouth daily. 08/07/18  Yes [provider]  psyllium (METAMUCIL SMOOTH TEXTURE) 28 % packet Take 1 packet by mouth daily as needed (constipation).   Yes [provider]  topiramate (TOPAMAX) 25 MG tablet Take 25 mg by mouth daily. 09/26/18  Yes [provider]  zolpidem (AMBIEN) 10 MG tablet Take 10 mg by mouth at bedtime as needed for sleep. 09/28/18  Yes [provider]    Family History Family History  Problem Relation Age of Onset   Breast cancer Neg Hx     Social History Social History   Tobacco Use   Smoking status: Former Smoker   Smokeless tobacco: Never Used  Substance Use Topics   Alcohol use: Yes    Comment: Rare use   Drug use: Never  Allergies   Trimethoprim; Amoxicillin; Sulfamethoxazole; and Codeine   Review of Systems Review of Systems  Constitutional: Negative for chills and fever.  Respiratory: Positive for shortness of breath.   Cardiovascular: Positive for chest pain. Negative for leg swelling.  Gastrointestinal: Positive for nausea and vomiting. Negative for abdominal pain, constipation and diarrhea.  Neurological: Positive for light-headedness. Negative for syncope.  All other systems reviewed and are negative.    Physical Exam Updated Vital Signs BP (!) 121/100     Pulse 83    Temp 98.1 F (36.7 C) (Oral)    Resp 18    Ht  (1.676 m)    Wt 78.5 kg    LMP 05/27/2013    SpO2 100%    BMI 27.92 kg/m   Physical Exam Vitals signs and nursing note reviewed.  Constitutional:      General: She is not in acute distress.    Appearance: She is well-developed.     Comments: Appears uncomfortable  HENT:     Head: Normocephalic and atraumatic.  Eyes:     General:        Right eye: No discharge.        Left eye: No discharge.     Conjunctiva/sclera: Conjunctivae normal.  Neck:     Vascular: No JVD.     Trachea: No tracheal deviation.  Cardiovascular:     Rate and Rhythm: Normal rate and regular rhythm.     Pulses: Normal pulses.     Heart sounds: Normal heart sounds.     Comments: 2+ radial and DP/PT pulses bilaterally, Homans sign absent bilaterally, no lower extremity edema, no palpable cords, compartments are soft  Pulmonary:     Effort: Pulmonary effort is normal.     Comments: Tachypneic, speaking in shorter sentences, SPO2 saturations 100% on room air Chest:     Chest wall: No tenderness.  Abdominal:     General: Abdomen is flat. Bowel sounds are normal. There is no distension.     Palpations: Abdomen is soft.     Tenderness: There is abdominal tenderness in the right upper quadrant and epigastric area. There is guarding. Positive signs include Murphy's sign.  Skin:    General: Skin is warm and dry.     Findings: No erythema.  Neurological:     Mental Status: She is alert.  Psychiatric:        Behavior: Behavior normal.      ED Treatments / Results  Labs (all labs ordered are listed, but only abnormal results are displayed) Labs Reviewed  COMPREHENSIVE METABOLIC PANEL - Abnormal; Notable for the following components:      Result Value   CO2 18 (*)    Glucose, Bld 108 (*)    Creatinine, Ser 1.08 (*)    Anion gap 16 (*)    All other components within normal limits  CBC WITH DIFFERENTIAL/PLATELET - Abnormal; Notable for the  following components:   WBC 16.6 (*)    Neutro Abs 13.6 (*)    All other components within normal limits  SARS CORONAVIRUS 2 (HOSPITAL ORDER, PERFORMED IN [facility])  TROPONIN I  D-DIMER, QUANTITATIVE (NOT AT [facility])  LIPASE, BLOOD  CBC  CREATININE, SERUM  HIV ANTIBODY (ROUTINE TESTING W REFLEX)    EKG EKG Interpretation  Date/Time:  Monday Oct 08 2018 10:51:03 EDT Ventricular Rate:  83 PR Interval:    QRS Duration: 84 QT Interval:  408 QTC Calculation: 480 R Axis:  57 Text Interpretation:  Sinus rhythm No previous tracing Confirmed by Cathren LaineSteinl, Kevin [phone]([id number]) on 10/08/2018 10:52:50 AM   Radiology Dg Chest 2 View  Result Date: 10/08/2018 CLINICAL DATA:  Epigastric pain, vomiting. EXAM: CHEST - 2 VIEW COMPARISON:  Chest x-ray dated 03/20/2015. FINDINGS: The heart size and mediastinal contours are within normal limits. Both lungs are clear. The visualized skeletal structures are unremarkable. IMPRESSION: No active cardiopulmonary disease. Electronically Signed   By: Bary RichardStan  Maynard M.D.   On: 10/08/2018 12:21   [place] Abdomen Limited  Result Date: 10/08/2018 CLINICAL DATA:  Chest pain radiating to the back EXAM: ULTRASOUND ABDOMEN LIMITED RIGHT UPPER QUADRANT COMPARISON:  None. FINDINGS: Gallbladder: Cholelithiasis with the largest gallstone measuring 13 mm. Gallbladder sludge. Gallbladder wall thickening measuring 9 mm. Trace pericholecystic fluid. Positive sonographic Murphy sign. Common bile duct: Diameter: 4.6 mm Liver: No focal lesion identified. Increased hepatic parenchymal echogenicity. Portal vein is patent on color Doppler imaging with normal direction of blood flow towards the liver. IMPRESSION: Cholelithiasis with findings most concerning for acute cholecystitis. Electronically Signed   By: Elige KoHetal  Patel   On: 10/08/2018 12:21    Procedures Procedures (including critical care time)  Medications Ordered in ED Medications  morphine 4 MG/ML injection (has no  administration in time range)  alum & mag hydroxide-simeth (MAALOX/MYLANTA) 200-200-20 MG/5ML suspension 30 mL (30 mLs Oral Not Given 10/08/18 1445)    And  lidocaine (XYLOCAINE) 2 % viscous mouth solution 15 mL (15 mLs Oral Not Given 10/08/18 1446)  enoxaparin (LOVENOX) injection 40 mg (has no administration in time range)  dextrose 5 %-0.45 % sodium chloride infusion (has no administration in time range)  acetaminophen (TYLENOL) tablet 650 mg (has no administration in time range)    Or  acetaminophen (TYLENOL) suppository 650 mg (has no administration in time range)  oxyCODONE (Oxy IR/ROXICODONE) immediate release tablet 5 mg (has no administration in time range)  morphine 2 MG/ML injection 2 mg (has no administration in time range)  ondansetron (ZOFRAN-ODT) disintegrating tablet 4 mg (has no administration in time range)    Or  ondansetron (ZOFRAN) injection 4 mg (has no administration in time range)  metoprolol tartrate (LOPRESSOR) injection 5 mg (has no administration in time range)  ciprofloxacin (CIPRO) IVPB 400 mg (has no administration in time range)  diphenhydrAMINE (BENADRYL) capsule 25 mg (has no administration in time range)    Or  diphenhydrAMINE (BENADRYL) injection 25 mg (has no administration in time range)  famotidine (PEPCID) IVPB 20 mg premix (0 mg Intravenous Stopped 10/08/18 1327)  morphine 4 MG/ML injection 4 mg (4 mg Intravenous Given 10/08/18 1302)  ciprofloxacin (CIPRO) IVPB 400 mg (0 mg Intravenous Stopped 10/08/18 1618)  HYDROmorphone (DILAUDID) injection 1 mg (1 mg Intravenous Given 10/08/18 1457)     Initial Impression / Assessment and Plan / ED Course  I have reviewed the triage vital signs and the nursing notes.  Pertinent labs & imaging results that were available during my care of the patient were reviewed by me and considered in my medical decision making (see chart for details).        Patient presenting for evaluation of severe epigastric abdominal  pain radiating to the midline chest and back beginning this morning with associated nausea and vomiting.  She is afebrile, vital signs are stable.  She is quite uncomfortable but nontoxic in appearance.  She does have right upper quadrant tenderness and positive Murphy sign on exam.  Will obtain lab work, chest x-ray, right upper  quadrant ultrasound, EKG, give pain control and reassess.   EKG shows normal sinus rhythm, no acute ischemic abnormalities noted.  Initial troponin is negative.  D-dimer is also negative.  Remainder of labs reviewed by me significant for leukocytosis of 16.6 with elevation in absolute neutrophil count.  Mildly elevated creatinine but BUN is within normal limits.  LFTs are within normal limits.  Lipase is also within normal limits.  However, her right upper quadrant ultrasound is concerning for acute cholecystitis with cholelithiasis, gallbladder sludge, gallbladder wall thickening, trace pericholecystic fluid and positive sonographic Murphy sign.  On reevaluation patient is resting more comfortably, reports pain has improved though she has required multiple doses for adequate pain control.  She has not had any emesis in the ED.  Low concern for obstruction, perforation, appendicitis.  Her cardiopulmonary work-up is negative and I doubt dissection, cardiac tamponade, PE, ACS/MI, pneumonia, CHF, esophageal rupture, or pneumothorax.  2:04PM Spoke with Dr. Sheliah Hatch with general surgery, who will see the patient emergently in the emergency department.  Plan for likely laparoscopic cholecystectomy.  Will give IV antibiotics in the ED.  She was given IV Cipro in the ED due to amoxicillin allergy. Final Clinical Impressions(s) / ED Diagnoses   Final diagnoses:  Calculus of gallbladder with acute cholecystitis without obstruction    ED Discharge Orders    None       Jeanie Sewer, PA-C 10/08/18 1647    Cathren Laine, MD 10/09/18 1424

## 2018-10-08 NOTE — Progress Notes (Signed)
1645 received pt from ED, A&O x4, walked to the bathroom right after arrival to the unit, with steady gait. RUQ pain getting better per pt.

## 2018-10-08 NOTE — ED Notes (Signed)
Sudden onset pain with increased SOB.   New orders given with pain relief and able to rest.

## 2018-10-09 ENCOUNTER — Encounter (HOSPITAL_COMMUNITY): Payer: Self-pay

## 2018-10-09 ENCOUNTER — Inpatient Hospital Stay (HOSPITAL_COMMUNITY): Payer: BLUE CROSS/BLUE SHIELD | Admitting: Anesthesiology

## 2018-10-09 ENCOUNTER — Encounter (HOSPITAL_COMMUNITY): Admission: EM | Disposition: A | Discharge status: Home / Self Care | Payer: Self-pay | Source: Home / Self Care

## 2018-10-09 HISTORY — PX: CHOLECYSTECTOMY: SHX55

## 2018-10-09 LAB — COMPREHENSIVE METABOLIC PANEL
ALT: 28 U/L (ref 0–44)
AST: 24 U/L (ref 15–41)
Albumin: 3.2 g/dL — ABNORMAL LOW (ref 3.5–5.0)
Alkaline Phosphatase: 84 U/L (ref 38–126)
Anion gap: 8 (ref 5–15)
BUN: 10 mg/dL (ref 6–20)
CO2: 22 mmol/L (ref 22–32)
Calcium: 8.6 mg/dL — ABNORMAL LOW (ref 8.9–10.3)
Chloride: 110 mmol/L (ref 98–111)
Creatinine, Ser: 1.05 mg/dL — ABNORMAL HIGH (ref 0.44–1.00)
GFR calc Af Amer: >60 mL/min (ref >60)
GFR calc non Af Amer: >60 mL/min (ref >60)
Glucose, Bld: 147 mg/dL — ABNORMAL HIGH (ref 70–99)
Potassium: 3.5 mmol/L (ref 3.5–5.1)
Sodium: 140 mmol/L (ref 135–145)
Total Bilirubin: 0.6 mg/dL (ref 0.3–1.2)
Total Protein: 5.7 g/dL — ABNORMAL LOW (ref 6.5–8.1)

## 2018-10-09 LAB — CBC
HCT: 35.5 % — ABNORMAL LOW (ref 36.0–46.0)
Hemoglobin: 11.5 g/dL — ABNORMAL LOW (ref 12.0–15.0)
MCH: 31.1 pg (ref 26.0–34.0)
MCHC: 32.4 g/dL (ref 30.0–36.0)
MCV: 95.9 fL (ref 80.0–100.0)
Platelets: 227 10*3/uL (ref 150–400)
RBC: 3.7 MIL/uL — ABNORMAL LOW (ref 3.87–5.11)
RDW: 13 % (ref 11.5–15.5)
WBC: 10.1 10*3/uL (ref 4.0–10.5)
nRBC: 0 % (ref 0.0–0.2)

## 2018-10-09 LAB — HIV ANTIBODY (ROUTINE TESTING W REFLEX): HIV Screen 4th Generation wRfx: NONREACTIVE

## 2018-10-09 SURGERY — LAPAROSCOPIC CHOLECYSTECTOMY
Anesthesia: General | Site: Abdomen

## 2018-10-09 MED ORDER — DEXAMETHASONE SODIUM PHOSPHATE 10 MG/ML IJ SOLN
INTRAMUSCULAR | Status: DC | PRN
  Administered 2018-10-09: 4 mg via INTRAVENOUS

## 2018-10-09 MED ORDER — ROCURONIUM BROMIDE 10 MG/ML (PF) SYRINGE
PREFILLED_SYRINGE | INTRAVENOUS | Status: DC | PRN
  Administered 2018-10-09: 40 mg via INTRAVENOUS

## 2018-10-09 MED ORDER — DEXMEDETOMIDINE HCL IN NACL 200 MCG/50ML IV SOLN
INTRAVENOUS | Status: DC | PRN
  Administered 2018-10-09: 15 ug via INTRAVENOUS

## 2018-10-09 MED ORDER — ENOXAPARIN SODIUM 40 MG/0.4ML ~~LOC~~ SOLN: 40.0000 mg | SUBCUTANEOUS | Status: DC

## 2018-10-09 MED ORDER — ROCURONIUM BROMIDE 10 MG/ML (PF) SYRINGE
PREFILLED_SYRINGE | INTRAVENOUS | Status: AC
  Filled 2018-10-09: qty 10

## 2018-10-09 MED ORDER — FENTANYL CITRATE (PF) 100 MCG/2ML IJ SOLN
25.0000 ug | INTRAMUSCULAR | Status: DC | PRN
  Administered 2018-10-09: 10:00:00 25 ug via INTRAVENOUS

## 2018-10-09 MED ORDER — DEXMEDETOMIDINE HCL IN NACL 200 MCG/50ML IV SOLN
INTRAVENOUS | Status: AC
  Filled 2018-10-09: qty 50

## 2018-10-09 MED ORDER — MIDAZOLAM HCL 5 MG/5ML IJ SOLN
INTRAMUSCULAR | Status: DC | PRN
  Administered 2018-10-09: 2 mg via INTRAVENOUS

## 2018-10-09 MED ORDER — OXYCODONE HCL 5 MG/5ML PO SOLN: 5.0000 mg | Freq: Once | ORAL | Status: AC | PRN

## 2018-10-09 MED ORDER — BUPIVACAINE-EPINEPHRINE 0.25% -1:200000 IJ SOLN
INTRAMUSCULAR | Status: DC | PRN
  Administered 2018-10-09: 30 mL

## 2018-10-09 MED ORDER — SUCCINYLCHOLINE CHLORIDE 200 MG/10ML IV SOSY
PREFILLED_SYRINGE | INTRAVENOUS | Status: AC
  Filled 2018-10-09: qty 10

## 2018-10-09 MED ORDER — FENTANYL CITRATE (PF) 250 MCG/5ML IJ SOLN
INTRAMUSCULAR | Status: DC | PRN
  Administered 2018-10-09: 100 ug via INTRAVENOUS
  Administered 2018-10-09: 50 ug via INTRAVENOUS
  Administered 2018-10-09: 100 ug via INTRAVENOUS

## 2018-10-09 MED ORDER — FENTANYL CITRATE (PF) 250 MCG/5ML IJ SOLN
INTRAMUSCULAR | Status: AC
  Filled 2018-10-09: qty 5

## 2018-10-09 MED ORDER — ONDANSETRON HCL 4 MG/2ML IJ SOLN: 4.0000 mg | Freq: Four times a day (QID) | INTRAMUSCULAR | Status: DC | PRN

## 2018-10-09 MED ORDER — ONDANSETRON HCL 4 MG/2ML IJ SOLN
INTRAMUSCULAR | Status: DC | PRN
  Administered 2018-10-09: 4 mg via INTRAVENOUS

## 2018-10-09 MED ORDER — BUPIVACAINE-EPINEPHRINE (PF) 0.25% -1:200000 IJ SOLN
INTRAMUSCULAR | Status: AC
  Filled 2018-10-09: qty 30

## 2018-10-09 MED ORDER — OXYCODONE HCL 5 MG PO TABS: 5.0000 mg | ORAL_TABLET | ORAL | 0 refills | Status: AC | PRN

## 2018-10-09 MED ORDER — OXYCODONE HCL 5 MG PO TABS
5.0000 mg | ORAL_TABLET | Freq: Once | ORAL | Status: AC | PRN
  Administered 2018-10-09: 10:00:00 5 mg via ORAL

## 2018-10-09 MED ORDER — LACTATED RINGERS IV SOLN
INTRAVENOUS | Status: DC
  Administered 2018-10-09 (×2): via INTRAVENOUS

## 2018-10-09 MED ORDER — ONDANSETRON HCL 4 MG/2ML IJ SOLN
INTRAMUSCULAR | Status: AC
  Filled 2018-10-09: qty 2

## 2018-10-09 MED ORDER — PROPOFOL 10 MG/ML IV BOLUS
INTRAVENOUS | Status: DC | PRN
  Administered 2018-10-09: 170 mg via INTRAVENOUS

## 2018-10-09 MED ORDER — OXYCODONE HCL 5 MG PO TABS
ORAL_TABLET | ORAL | Status: AC
  Filled 2018-10-09: qty 1

## 2018-10-09 MED ORDER — DEXAMETHASONE SODIUM PHOSPHATE 10 MG/ML IJ SOLN
INTRAMUSCULAR | Status: AC
  Filled 2018-10-09: qty 1

## 2018-10-09 MED ORDER — MIDAZOLAM HCL 2 MG/2ML IJ SOLN
INTRAMUSCULAR | Status: AC
  Filled 2018-10-09: qty 2

## 2018-10-09 MED ORDER — SUCCINYLCHOLINE CHLORIDE 200 MG/10ML IV SOSY
PREFILLED_SYRINGE | INTRAVENOUS | Status: DC | PRN
  Administered 2018-10-09: 100 mg via INTRAVENOUS

## 2018-10-09 MED ORDER — SODIUM CHLORIDE 0.9 % IR SOLN
Status: DC | PRN
  Administered 2018-10-09: 1000 mL

## 2018-10-09 MED ORDER — LIDOCAINE 2% (20 MG/ML) 5 ML SYRINGE
INTRAMUSCULAR | Status: AC
  Filled 2018-10-09: qty 5

## 2018-10-09 MED ORDER — FENTANYL CITRATE (PF) 100 MCG/2ML IJ SOLN: 25.0000 ug | INTRAMUSCULAR | Status: DC | PRN

## 2018-10-09 MED ORDER — STERILE WATER FOR IRRIGATION IR SOLN
Status: DC | PRN
  Administered 2018-10-09: 1000 mL

## 2018-10-09 MED ORDER — 0.9 % SODIUM CHLORIDE (POUR BTL) OPTIME
TOPICAL | Status: DC | PRN
  Administered 2018-10-09: 09:00:00 1000 mL

## 2018-10-09 MED ORDER — LIDOCAINE 2% (20 MG/ML) 5 ML SYRINGE
INTRAMUSCULAR | Status: DC | PRN
  Administered 2018-10-09: 60 mg via INTRAVENOUS

## 2018-10-09 MED ORDER — SUGAMMADEX SODIUM 200 MG/2ML IV SOLN
INTRAVENOUS | Status: DC | PRN
  Administered 2018-10-09: 157 mg via INTRAVENOUS

## 2018-10-09 MED ORDER — MORPHINE SULFATE (PF) 2 MG/ML IV SOLN
1.0000 mg | INTRAVENOUS | Status: DC | PRN
  Administered 2018-10-09: 2 mg via INTRAVENOUS
  Filled 2018-10-09: qty 1

## 2018-10-09 MED ORDER — FENTANYL CITRATE (PF) 100 MCG/2ML IJ SOLN
INTRAMUSCULAR | Status: AC
  Filled 2018-10-09: qty 2

## 2018-10-09 MED ORDER — ACETAMINOPHEN 500 MG PO TABS
1000.0000 mg | ORAL_TABLET | Freq: Four times a day (QID) | ORAL | Status: DC
  Administered 2018-10-09: 11:00:00 1000 mg via ORAL
  Filled 2018-10-09: qty 2

## 2018-10-09 SURGICAL SUPPLY — 37 items
ADH SKN CLS APL DERMABOND .7 (GAUZE/BANDAGES/DRESSINGS) ×1
APL PRP STRL LF DISP 70% ISPRP (MISCELLANEOUS) ×1
BAG SPEC RTRVL 10 TROC 200 (ENDOMECHANICALS) ×1
CANISTER SUCT 3000ML PPV (MISCELLANEOUS) ×3 IMPLANT
CHLORAPREP W/TINT 26 (MISCELLANEOUS) ×2 IMPLANT
CLIP VESOLOCK MED LG 6/CT (CLIP) ×2 IMPLANT
CLIP VESOLOCK XL 6/CT (CLIP) IMPLANT
COVER SURGICAL LIGHT HANDLE (MISCELLANEOUS) ×3 IMPLANT
COVER WAND RF STERILE (DRAPES) ×3 IMPLANT
DERMABOND ADVANCED (GAUZE/BANDAGES/DRESSINGS) ×2
DERMABOND ADVANCED .7 DNX12 (GAUZE/BANDAGES/DRESSINGS) ×1 IMPLANT
ELECT REM PT RETURN 9FT ADLT (ELECTROSURGICAL) ×3
ELECTRODE REM PT RTRN 9FT ADLT (ELECTROSURGICAL) ×1 IMPLANT
GLOVE BIOGEL PI IND STRL 7.0 (GLOVE) ×1 IMPLANT
GLOVE BIOGEL PI INDICATOR 7.0 (GLOVE) ×2
GLOVE SURG SS PI 7.0 STRL IVOR (GLOVE) ×3 IMPLANT
GOWN STRL REUS W/ TWL LRG LVL3 (GOWN DISPOSABLE) ×3 IMPLANT
GOWN STRL REUS W/TWL LRG LVL3 (GOWN DISPOSABLE) ×9
GRASPER SUT TROCAR 14GX15 (MISCELLANEOUS) ×3 IMPLANT
KIT BASIN OR (CUSTOM PROCEDURE TRAY) ×3 IMPLANT
KIT TURNOVER KIT B (KITS) ×3 IMPLANT
NEEDLE 22X1 1/2 (OR ONLY) (NEEDLE) ×3 IMPLANT
NS IRRIG 1000ML POUR BTL (IV SOLUTION) ×3 IMPLANT
PAD ARMBOARD 7.5X6 YLW CONV (MISCELLANEOUS) ×3 IMPLANT
POUCH RETRIEVAL ECOSAC 10 (ENDOMECHANICALS) ×1 IMPLANT
POUCH RETRIEVAL ECOSAC 10MM (ENDOMECHANICALS) ×2
SCISSORS LAP 5X35 DISP (ENDOMECHANICALS) ×3 IMPLANT
SET IRRIG TUBING LAPAROSCOPIC (IRRIGATION / IRRIGATOR) ×3 IMPLANT
SET TUBE SMOKE EVAC HIGH FLOW (TUBING) ×3 IMPLANT
SLEEVE ENDOPATH XCEL 5M (ENDOMECHANICALS) ×6 IMPLANT
SPECIMEN JAR SMALL (MISCELLANEOUS) ×3 IMPLANT
SUT MNCRL AB 4-0 PS2 18 (SUTURE) ×3 IMPLANT
TOWEL GREEN STERILE FF (TOWEL DISPOSABLE) ×2 IMPLANT
TRAY LAPAROSCOPIC MC (CUSTOM PROCEDURE TRAY) ×3 IMPLANT
TROCAR XCEL 12X100 BLDLESS (ENDOMECHANICALS) ×3 IMPLANT
TROCAR XCEL NON-BLD 5MMX100MML (ENDOMECHANICALS) ×3 IMPLANT
WATER STERILE IRR 1000ML POUR (IV SOLUTION) ×3 IMPLANT

## 2018-10-09 NOTE — Discharge Summary (Signed)
     Patient ID: Donna Glass [id number] Apr 30, 1971 48 y.o.  Admit date: 10/08/2018 Discharge date: 10/09/2018  Admitting Diagnosis: Acute cholecystitis   Discharge Diagnosis Patient Active Problem List   Diagnosis Date Noted  . Acute calculous cholecystitis 10/08/2018    Consultants none  Reason for Admission: 48 yo female with 1 day of epigastric pain. She woke up with the pain. It is intense. It radiates to her back. She has never had a pain like this before. She has been vomiting multiple times today. She denies diarrhea or constipation. She denies odd foods yesterday.  Procedures Lap chole, Dr. Sheliah Hatch 5/26  Hospital Course:  The patient was admitted and underwent a laparoscopic cholecystectomy.  The patient tolerated the procedure well.  On POD 0, the patient was tolerating a regular diet, voiding well, mobilizing, and pain was controlled with oral pain medications.  The patient was stable for DC home at this time with appropriate follow up made.     Physical Exam: Abd: soft, appropriately tender, incisions are c/d/i covered in dermabond.  ND  Allergies as of 10/09/2018      Reactions   Trimethoprim Hives, Rash   Amoxicillin Hives   Sulfamethoxazole Hives   Codeine Palpitations      Medication List    STOP taking these medications   aspirin 325 MG tablet     TAKE these medications   acetaminophen 500 MG tablet Commonly known as:  TYLENOL Take 1,000 mg by mouth every 6 (six) hours as needed for mild pain.   anti-nausea solution Take 10 mLs by mouth every 15 (fifteen) minutes as needed for nausea or vomiting.   bismuth subsalicylate 262 MG chewable tablet Commonly known as:  PEPTO BISMOL Chew 524 mg by mouth as needed for indigestion.   buPROPion 300 MG 24 hr tablet Commonly known as:  WELLBUTRIN XL Take 300 mg by mouth daily.   diphenhydrAMINE 25 MG tablet Commonly known as:  BENADRYL Take 25 mg by mouth as needed for allergies.   Duavee  0.45-20 MG Tabs Generic drug:  Conj Estrogens-Bazedoxifene Take 1 tablet by mouth daily.   oxyCODONE 5 MG immediate release tablet Commonly known as:  Oxy IR/ROXICODONE Take 1 tablet (5 mg total) by mouth every 4 (four) hours as needed for moderate pain.   Phentermine HCl 8 MG Tabs Take 8 mg by mouth daily.   psyllium 28 % packet Commonly known as:  METAMUCIL SMOOTH TEXTURE Take 1 packet by mouth daily as needed (constipation).   topiramate 25 MG tablet Commonly known as:  TOPAMAX Take 25 mg by mouth daily.   zolpidem 10 MG tablet Commonly known as:  AMBIEN Take 10 mg by mouth at bedtime as needed for sleep.        Follow-up Information    Sur[facility], [facility] Follow up on 10/23/2018.   Specialty:  General Surgery Why:  8:30am. This will be a telephone visit.  please be near your phone at 8:30am for our office PA-C to call you. Contact information: [address] [place] [address] [phone]           Signed: Barnetta Chapel, [facility] 10/09/2018, 2:23 PM Pager: [phone]

## 2018-10-09 NOTE — Anesthesia Procedure Notes (Signed)
Procedure Name: Intubation Date/Time: 10/09/2018 8:54 AM Performed by: Adria Dill, CRNA Pre-anesthesia Checklist: Patient identified, Emergency Drugs available, Suction available and Patient being monitored Patient Re-evaluated:Patient Re-evaluated prior to induction Oxygen Delivery Method: Circle system utilized Preoxygenation: Pre-oxygenation with 100% oxygen Induction Type: IV induction and Rapid sequence Laryngoscope Size: Miller and 2 Grade View: Grade II Tube type: Oral Tube size: 7.0 mm Number of attempts: 1 Airway Equipment and Method: Stylet and Oral airway Placement Confirmation: ETT inserted through vocal cords under direct vision,  positive ETCO2 and breath sounds checked- equal and bilateral Secured at: 21 cm Tube secured with: Tape Dental Injury: Teeth and Oropharynx as per pre-operative assessment

## 2018-10-09 NOTE — Transfer of Care (Signed)
Immediate Anesthesia Transfer of Care Note  Patient: Donna Glass  Procedure(s) Performed: LAPAROSCOPIC CHOLECYSTECTOMY (N/A Abdomen)  Patient Location: PACU  Anesthesia Type:General  Level of Consciousness: awake, drowsy and patient cooperative  Airway & Oxygen Therapy: Patient Spontanous Breathing and Patient connected to nasal cannula oxygen  Post-op Assessment: Report given to RN and Post -op Vital signs reviewed and stable  Post vital signs: Reviewed and stable  Last Vitals:  Vitals Value Taken Time  BP 136/87 10/09/2018  9:56 AM  Temp    Pulse 92 10/09/2018  9:57 AM  Resp 23 10/09/2018  9:57 AM  SpO2 100 % 10/09/2018  9:57 AM  Vitals shown include unvalidated device data.  Last Pain:  Vitals:   10/09/18 0802  TempSrc:   PainSc: 2       Patients Stated Pain Goal: 1 (10/09/18 0732)  Complications: No apparent anesthesia complications

## 2018-10-09 NOTE — Progress Notes (Signed)
Pt came down for surgery with underwear still on.   Underwear removed and put in pt belonging bag.  6N floor called to come pick up belong bag @ 0810.

## 2018-10-09 NOTE — Op Note (Signed)
PATIENT:  Donna Glass  48 y.o. female  PRE-OPERATIVE DIAGNOSIS:  cholecystitis  POST-OPERATIVE DIAGNOSIS:  cholecystitis  PROCEDURE:  Procedure(s): LAPAROSCOPIC CHOLECYSTECTOMY   SURGEON:  Surgeon(s): , De Blanch, MD  ASSISTANT: none  ANESTHESIA:   local and general  Indications for procedure: Donna Glass is a 48 y.o. female with symptoms of Abdominal pain and Nausea and vomiting consistent with gallbladder disease, Confirmed by Ultrasound.  Description of procedure: The patient was brought into the operative suite, placed supine. Anesthesia was administered with endotracheal tube. Patient was strapped in place and foot board was secured. All pressure points were offloaded by foam padding. The patient was prepped and draped in the usual sterile fashion.  A small incision was made in the right upper quadrant. A 19mm trocar was inserted into the peritoneal cavity with optical entry. Pneumoperitoneum was applied with high flow low pressure. 1 46mm trocar was placed in the periumbilical space. 1 65mm trocar was placed in the right lateral space. A 38mm trocar was placed in the subxiphoid space. Marcaine was infused to the subxiphoid space and lateral upper right abdomen in the transversus abdominis plane. Next the patient was placed in reverse trendelenberg. The gallbladder was white in color and edematous and distended.  The gallbladder was retracted cephalad and lateral. The peritoneum was reflected off the infundibulum working lateral to medial. The cystic duct and cystic artery were identified and further dissection revealed a critical view. The cystic duct and cystic artery were doubly clipped and ligated.   The gallbladder was removed off the liver bed with cautery. The Gallbladder was placed in a specimen bag. The gallbladder fossa was irrigated and hemostasis was applied with cautery. The gallbladder was removed via the 12mm trocar. The fascial defect was closed with  interrupted 0 vicryl suture via laparoscopic trans-fascial suture passer. Pneumoperitoneum was removed, all trocar were removed. All incisions were closed with 4-0 monocryl subcuticular stitch. The patient woke from anesthesia and was brought to PACU in stable condition. All counts were correct  Findings: inflamed gallbladder  Specimen: gallbladder  Blood loss: 30 ml  Local anesthesia: 30 ml marcaine  Complications: none  PLAN OF CARE: Admit to inpatient   PATIENT DISPOSITION:  PACU - hemodynamically stable.  Feliciana Rossetti, M.D. General, Bariatric, & Minimally Invasive Surgery [facility]

## 2018-10-09 NOTE — Anesthesia Preprocedure Evaluation (Addendum)
Anesthesia Evaluation  Patient identified by MRN, date of birth, ID band Patient awake    Reviewed: Allergy & Precautions, H&P , NPO status , Patient's Chart, lab work & pertinent test results  Airway Mallampati: II   Neck ROM: full    Dental no notable dental hx. (+) Dental Advisory Given, Teeth Intact   Pulmonary former smoker,    breath sounds clear to auscultation       Cardiovascular negative cardio ROS   Rhythm:regular Rate:Normal     Neuro/Psych    GI/Hepatic cholecystitis   Endo/Other    Renal/GU      Musculoskeletal   Abdominal   Peds  Hematology   Anesthesia Other Findings   Reproductive/Obstetrics                            Anesthesia Physical Anesthesia Plan  ASA: II  Anesthesia Plan: General   Post-op Pain Management:    Induction: Intravenous  PONV Risk Score and Plan: 3 and Ondansetron, Dexamethasone, Treatment may vary due to age or medical condition and Midazolam  Airway Management Planned: Oral ETT  Additional Equipment:   Intra-op Plan:   Post-operative Plan: Extubation in OR  Informed Consent: I have reviewed the patients History and Physical, chart, labs and discussed the procedure including the risks, benefits and alternatives for the proposed anesthesia with the patient or authorized representative who has indicated his/her understanding and acceptance.       Plan Discussed with: CRNA, Anesthesiologist and Surgeon  Anesthesia Plan Comments:         Anesthesia Quick Evaluation

## 2018-10-09 NOTE — Progress Notes (Signed)
Earrings put in pt container along with cell phone and placed in bedside drawer.

## 2018-10-09 NOTE — Discharge Instructions (Signed)
Please send a photo to [email or web address] if you have any questions or concerns regarding your incisions   Managing Your Pain After Surgery Without Opioids - DO NOT TAKE NSAIDS due to your kidney disease    Thank you for participating in our program to help patients manage their pain after surgery without opioids. This is part of our effort to provide you with the best care possible, without exposing you or your family to the risk that opioids pose.  What pain can I expect after surgery? You can expect to have some pain after surgery. This is normal. The pain is typically worse the day after surgery, and quickly begins to get better. Many studies have found that many patients are able to manage their pain after surgery with Over-the-Counter (OTC) medications such as Tylenol and Motrin. If you have a condition that does not allow you to take Tylenol or Motrin, notify your surgical team.  How will I manage my pain? The best strategy for controlling your pain after surgery is around the clock pain control with Tylenol (acetaminophen) and Motrin (ibuprofen or Advil). Alternating these medications with each other allows you to maximize your pain control. In addition to Tylenol and Motrin, you can use heating pads or ice packs on your incisions to help reduce your pain.  How will I alternate your regular strength over-the-counter pain medication? You will take a dose of pain medication every three hours. ; Start by taking 650 mg of Tylenol (2 pills of 325 mg) ; 3 hours later take 600 mg of Motrin (3 pills of 200 mg) ; 3 hours after taking the Motrin take 650 mg of Tylenol ; 3 hours after that take 600 mg of Motrin.   - 1 -  See example - if your first dose of Tylenol is at 12:00 PM   12:00 PM Tylenol 650 mg (2 pills of 325 mg)  3:00 PM Motrin 600 mg (3 pills of 200 mg)  6:00 PM Tylenol 650 mg (2 pills of 325 mg)  9:00 PM Motrin 600 mg (3 pills of 200 mg)  Continue  alternating every 3 hours   We recommend that you follow this schedule around-the-clock for at least 3 days after surgery, or until you feel that it is no longer needed. Use the table on the last page of this handout to keep track of the medications you are taking. Important: Do not take more than 3000mg  of Tylenol in a 24-hour period. Do not take ibuprofen/Motrin if you have a history of bleeding stomach ulcers, severe kidney disease, &/or actively taking a blood thinner  What if I still have pain? If you have pain that is not controlled with the over-the-counter pain medications (Tylenol and Motrin or Advil) you might have what we call breakthrough pain. You will receive a prescription for a small amount of an opioid pain medication such as Oxycodone, Tramadol, or Tylenol with Codeine. Use these opioid pills in the first 24 hours after surgery if you have breakthrough pain. Do not take more than 1 pill every 4-6 hours.  If you still have uncontrolled pain after using all opioid pills, don't hesitate to call our staff using the number provided. We will help make sure you are managing your pain in the best way possible, and if necessary, we can provide a prescription for additional pain medication.   Day 1    Time  Name of Medication Number of pills taken  Amount of Acetaminophen  Pain Level  Comments  AM PM       AM PM       AM PM       AM PM       AM PM       AM PM       AM PM       AM PM       Total Daily amount of Acetaminophen Do not take more than  3,000 mg per day      Day 2    Time  Name of Medication Number of pills taken  Amount of Acetaminophen  Pain Level   Comments  AM PM       AM PM       AM PM       AM PM       AM PM       AM PM       AM PM       AM PM       Total Daily amount of Acetaminophen Do not take more than  3,000 mg per day      Day 3    Time  Name of Medication Number of pills taken  Amount of Acetaminophen  Pain Level    Comments  AM PM       AM PM       AM PM       AM PM          AM PM       AM PM       AM PM       AM PM       Total Daily amount of Acetaminophen Do not take more than  3,000 mg per day      Day 4    Time  Name of Medication Number of pills taken  Amount of Acetaminophen  Pain Level   Comments  AM PM       AM PM       AM PM       AM PM       AM PM       AM PM       AM PM       AM PM       Total Daily amount of Acetaminophen Do not take more than  3,000 mg per day      Day 5    Time  Name of Medication Number of pills taken  Amount of Acetaminophen  Pain Level   Comments  AM PM       AM PM       AM PM       AM PM       AM PM       AM PM       AM PM       AM PM       Total Daily amount of Acetaminophen Do not take more than  3,000 mg per day       Day 6    Time  Name of Medication Number of pills taken  Amount of Acetaminophen  Pain Level  Comments  AM PM       AM PM       AM PM       AM PM       AM PM       AM PM       AM  PM       AM PM       Total Daily amount of Acetaminophen Do not take more than  3,000 mg per day      Day 7    Time  Name of Medication Number of pills taken  Amount of Acetaminophen  Pain Level   Comments  AM PM       AM PM       AM PM       AM PM       AM PM       AM PM       AM PM       AM PM       Total Daily amount of Acetaminophen Do not take more than  3,000 mg per day        For additional information about how and where to safely dispose of unused opioid medications - [email or web address]  Disclaimer: This document contains information and/or instructional materials adapted from [facility] for the typical patient with your condition. It does not replace medical advice from your health care provider because your experience may differ from that of the typical patient. Talk to your health care provider if you have any questions about this document, your condition or your  treatment plan. Adapted from [facility] LAPAROSCOPIC SURGERY: POST OP INSTRUCTIONS Always review your discharge instruction sheet given to you by the facility where your surgery was performed. IF YOU HAVE DISABILITY OR FAMILY LEAVE FORMS, YOU MUST BRING THEM TO THE OFFICE FOR PROCESSING.   DO NOT GIVE THEM TO YOUR DOCTOR.  PAIN CONTROL  1. First take acetaminophen (Tylenol) to control your pain after surgery.  Follow directions on package.  Taking acetaminophen (Tylenol) and/or ibuprofen (Advil) regularly after surgery will help to control your pain and lower the amount of prescription pain medication you may need.  You should not take more than 3,000 mg (3 grams) of acetaminophen (Tylenol) in 24 hours.  You should not take ibuprofen (Advil), aleve, motrin, naprosyn or other NSAIDS if you have a history of stomach ulcers or chronic kidney disease.  2. A prescription for pain medication may be given to you upon discharge.  Take your pain medication as prescribed, if you still have uncontrolled pain after taking acetaminophen (Tylenol) or ibuprofen (Advil). 3. Use ice packs to help control pain. 4. If you need a refill on your pain medication, please contact your pharmacy.  They will contact our office to request authorization. Prescriptions will not be filled after 5pm or on week-ends.  HOME MEDICATIONS 5. Take your usually prescribed medications unless otherwise directed.  DIET 6. You should follow a light diet the first few days after arrival home.  Be sure to include lots of fluids daily. Avoid fatty, fried foods.   CONSTIPATION 7. It is common to experience some constipation after surgery and if you are taking pain medication.  Increasing fluid intake and taking a stool softener (such as Colace) will usually help or prevent this problem from occurring.  A mild laxative (Milk of Magnesia or Miralax) should be taken according to package instructions if  there are no bowel movements after 48 hours.  WOUND/INCISION CARE 8. Most patients will experience some swelling and bruising in the area of the incisions.  Ice packs will help.  Swelling and bruising can take several days to resolve.  9. Unless discharge instructions indicate otherwise, follow guidelines below  a. STERI-STRIPS -  you may remove your outer bandages 48 hours after surgery, and you may shower at that time.  You have steri-strips (small skin tapes) in place directly over the incision.  These strips should be left on the skin for 7-10 days.   b. DERMABOND/SKIN GLUE - you may shower in 24 hours.  The glue will flake off over the next 2-3 weeks. 10. Any sutures or staples will be removed at the office during your follow-up visit.  ACTIVITIES 11. You may resume regular (light) daily activities beginning the next day--such as daily self-care, walking, climbing stairs--gradually increasing activities as tolerated.  You may have sexual intercourse when it is comfortable.  Refrain from any heavy lifting or straining until approved by your doctor. a. You may drive when you are no longer taking prescription pain medication, you can comfortably wear a seatbelt, and you can safely maneuver your car and apply brakes.  FOLLOW-UP 12. You should see your doctor in the office for a follow-up appointment approximately 2-3 weeks after your surgery.  You should have been given your post-op/follow-up appointment when your surgery was scheduled.  If you did not receive a post-op/follow-up appointment, make sure that you call for this appointment within a day or two after you arrive home to insure a convenient appointment time.   WHEN TO CALL YOUR DOCTOR: 1. Fever over 101.0 2. Inability to urinate 3. Continued bleeding from incision. 4. Increased pain, redness, or drainage from the incision. 5. Increasing abdominal pain  The clinic staff is available to answer your questions during regular business  hours.  Please dont hesitate to call and ask to speak to one of the nurses for clinical concerns.  If you have a medical emergency, go to the nearest emergency room or call 911.  A surgeon from [facility] is always on call at the hospital. [address], [address], [place], [place]  [address] ? [address] [address], [place], [place]   [address] [phone] ? [phone] ? FAX [phone] Web site: [email or web address]

## 2018-10-09 NOTE — Progress Notes (Signed)
Pt states that she feels like her pain is controlled and is ready to discharge home. Discharge instructions gone over with her and pt verbalized understanding. All belongings gathered to be sent home. Pt in no distress at discharge.

## 2018-10-10 ENCOUNTER — Encounter (HOSPITAL_COMMUNITY): Payer: Self-pay | Admitting: General Surgery

## 2018-10-10 NOTE — Anesthesia Postprocedure Evaluation (Signed)
Anesthesia Post Note  Patient: Donna Glass  Procedure(s) Performed: LAPAROSCOPIC CHOLECYSTECTOMY (N/A Abdomen)     Patient location during evaluation: PACU Anesthesia Type: General Level of consciousness: awake and alert Pain management: pain level controlled Vital Signs Assessment: post-procedure vital signs reviewed and stable Respiratory status: spontaneous breathing, nonlabored ventilation, respiratory function stable and patient connected to nasal cannula oxygen Cardiovascular status: blood pressure returned to baseline and stable Postop Assessment: no apparent nausea or vomiting Anesthetic complications: no    Last Vitals:  Vitals:   10/09/18 1044 10/09/18 1313  BP: 137/81 124/74  Pulse: 68 84  Resp: 20 18  Temp: 36.6 C 36.8 C  SpO2: 100% 98%    Last Pain:  Vitals:   10/09/18 1519  TempSrc:   PainSc: 2                  Shaquayla Klimas S

## 2018-12-26 ENCOUNTER — Other Ambulatory Visit: Payer: Self-pay | Admitting: Obstetrics and Gynecology

## 2018-12-26 DIAGNOSIS — Z1231 Encounter for screening mammogram for malignant neoplasm of breast: Secondary | ICD-10-CM

## 2019-02-12 ENCOUNTER — Ambulatory Visit
Admission: RE | Admit: 2019-02-12 | Discharge: 2019-02-12 | Disposition: A | Discharge status: Home / Self Care | Payer: BLUE CROSS/BLUE SHIELD | Source: Ambulatory Visit | Attending: Obstetrics and Gynecology | Admitting: Obstetrics and Gynecology

## 2019-02-12 ENCOUNTER — Other Ambulatory Visit: Payer: Self-pay

## 2019-02-12 DIAGNOSIS — Z1231 Encounter for screening mammogram for malignant neoplasm of breast: Secondary | ICD-10-CM

## 2020-01-14 ENCOUNTER — Other Ambulatory Visit: Payer: Self-pay | Admitting: Obstetrics and Gynecology

## 2020-01-14 DIAGNOSIS — Z1231 Encounter for screening mammogram for malignant neoplasm of breast: Secondary | ICD-10-CM

## 2020-02-13 ENCOUNTER — Ambulatory Visit
Admission: RE | Admit: 2020-02-13 | Discharge: 2020-02-13 | Disposition: A | Discharge status: Home / Self Care | Payer: BC Managed Care – PPO | Source: Ambulatory Visit | Attending: Obstetrics and Gynecology | Admitting: Obstetrics and Gynecology

## 2020-02-13 ENCOUNTER — Other Ambulatory Visit: Payer: Self-pay

## 2020-02-13 DIAGNOSIS — Z1231 Encounter for screening mammogram for malignant neoplasm of breast: Secondary | ICD-10-CM

## 2020-04-14 IMAGING — DX CHEST - 2 VIEW
2 series · 2 of 2 positions shown · non-contrast
Comparison: Chest x-ray dated 03/20/2015.

CLINICAL DATA: Epigastric pain, vomiting.

EXAM:
CHEST - 2 VIEW

[chest pa]
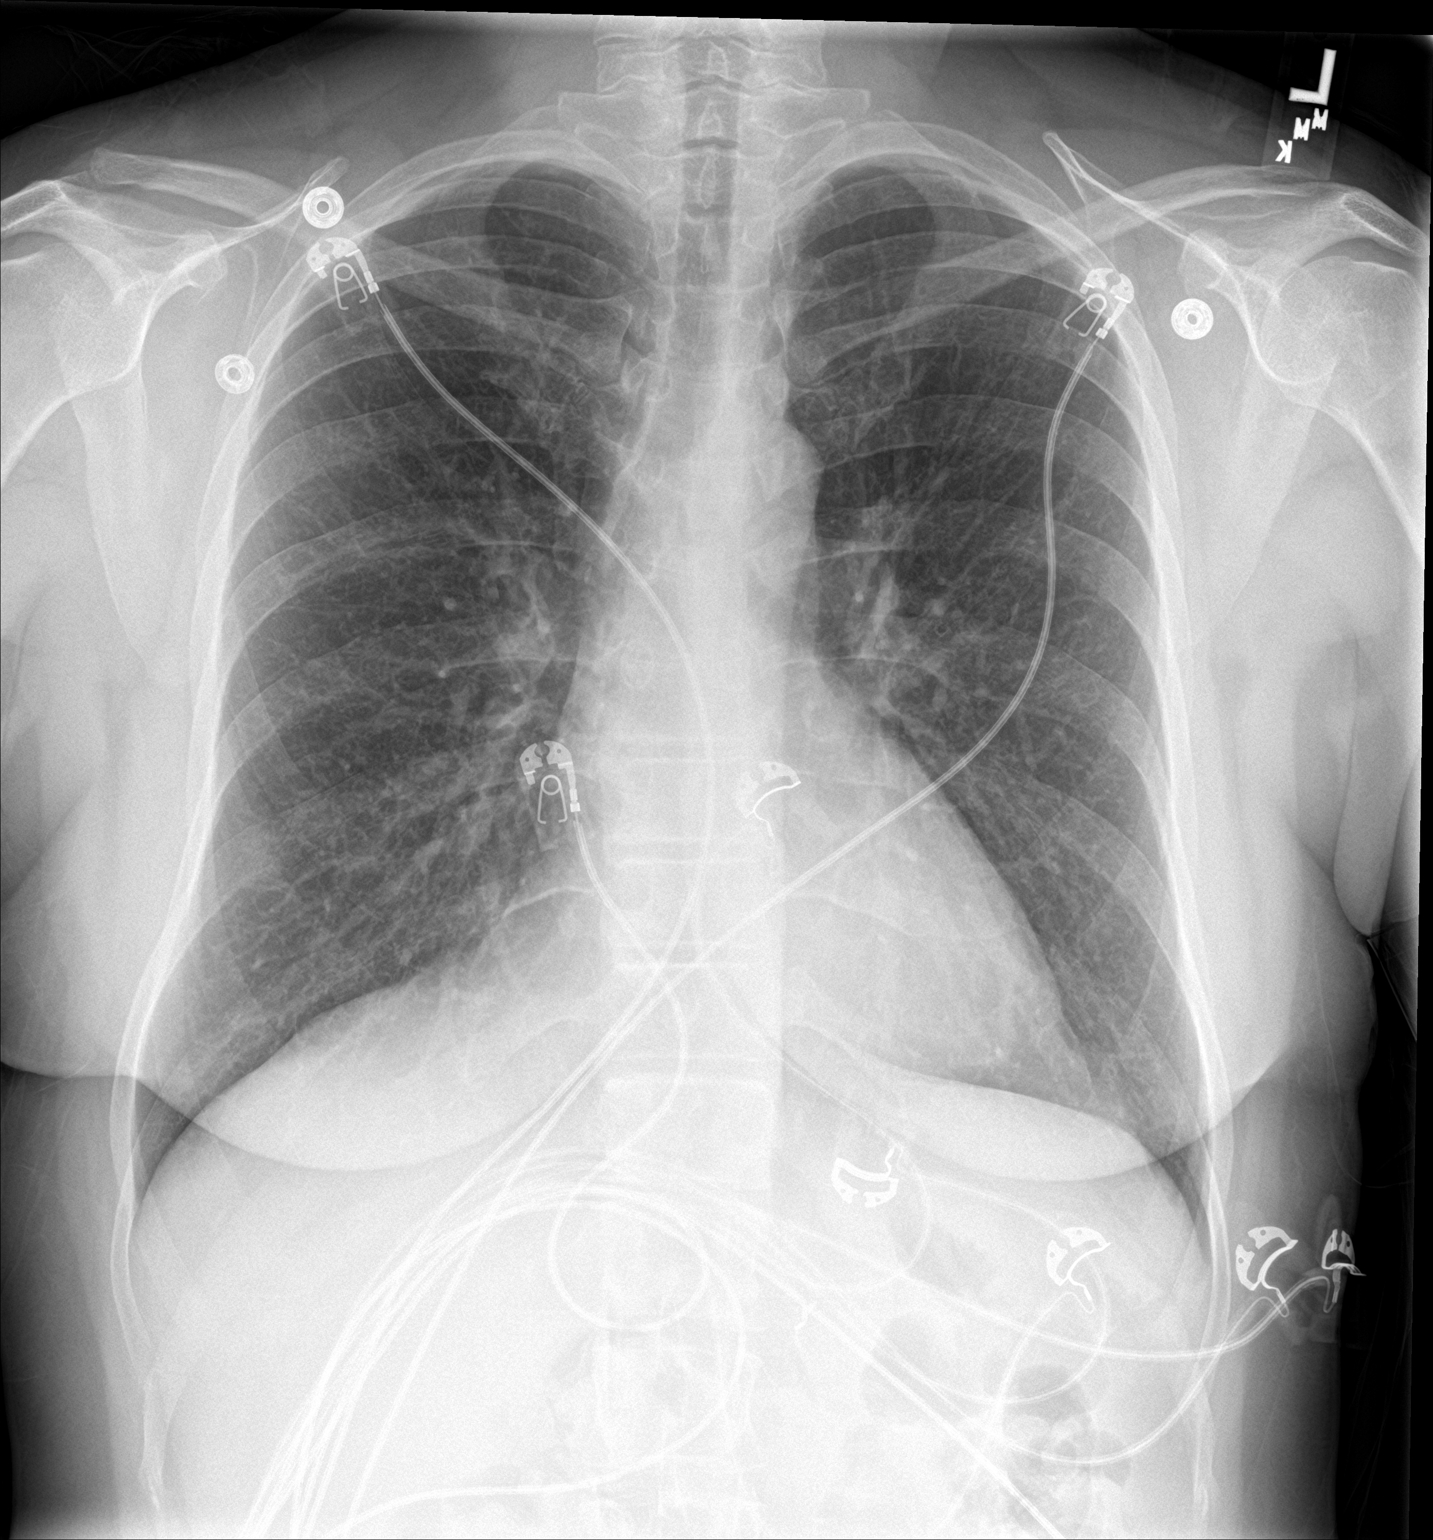

[chest lat]
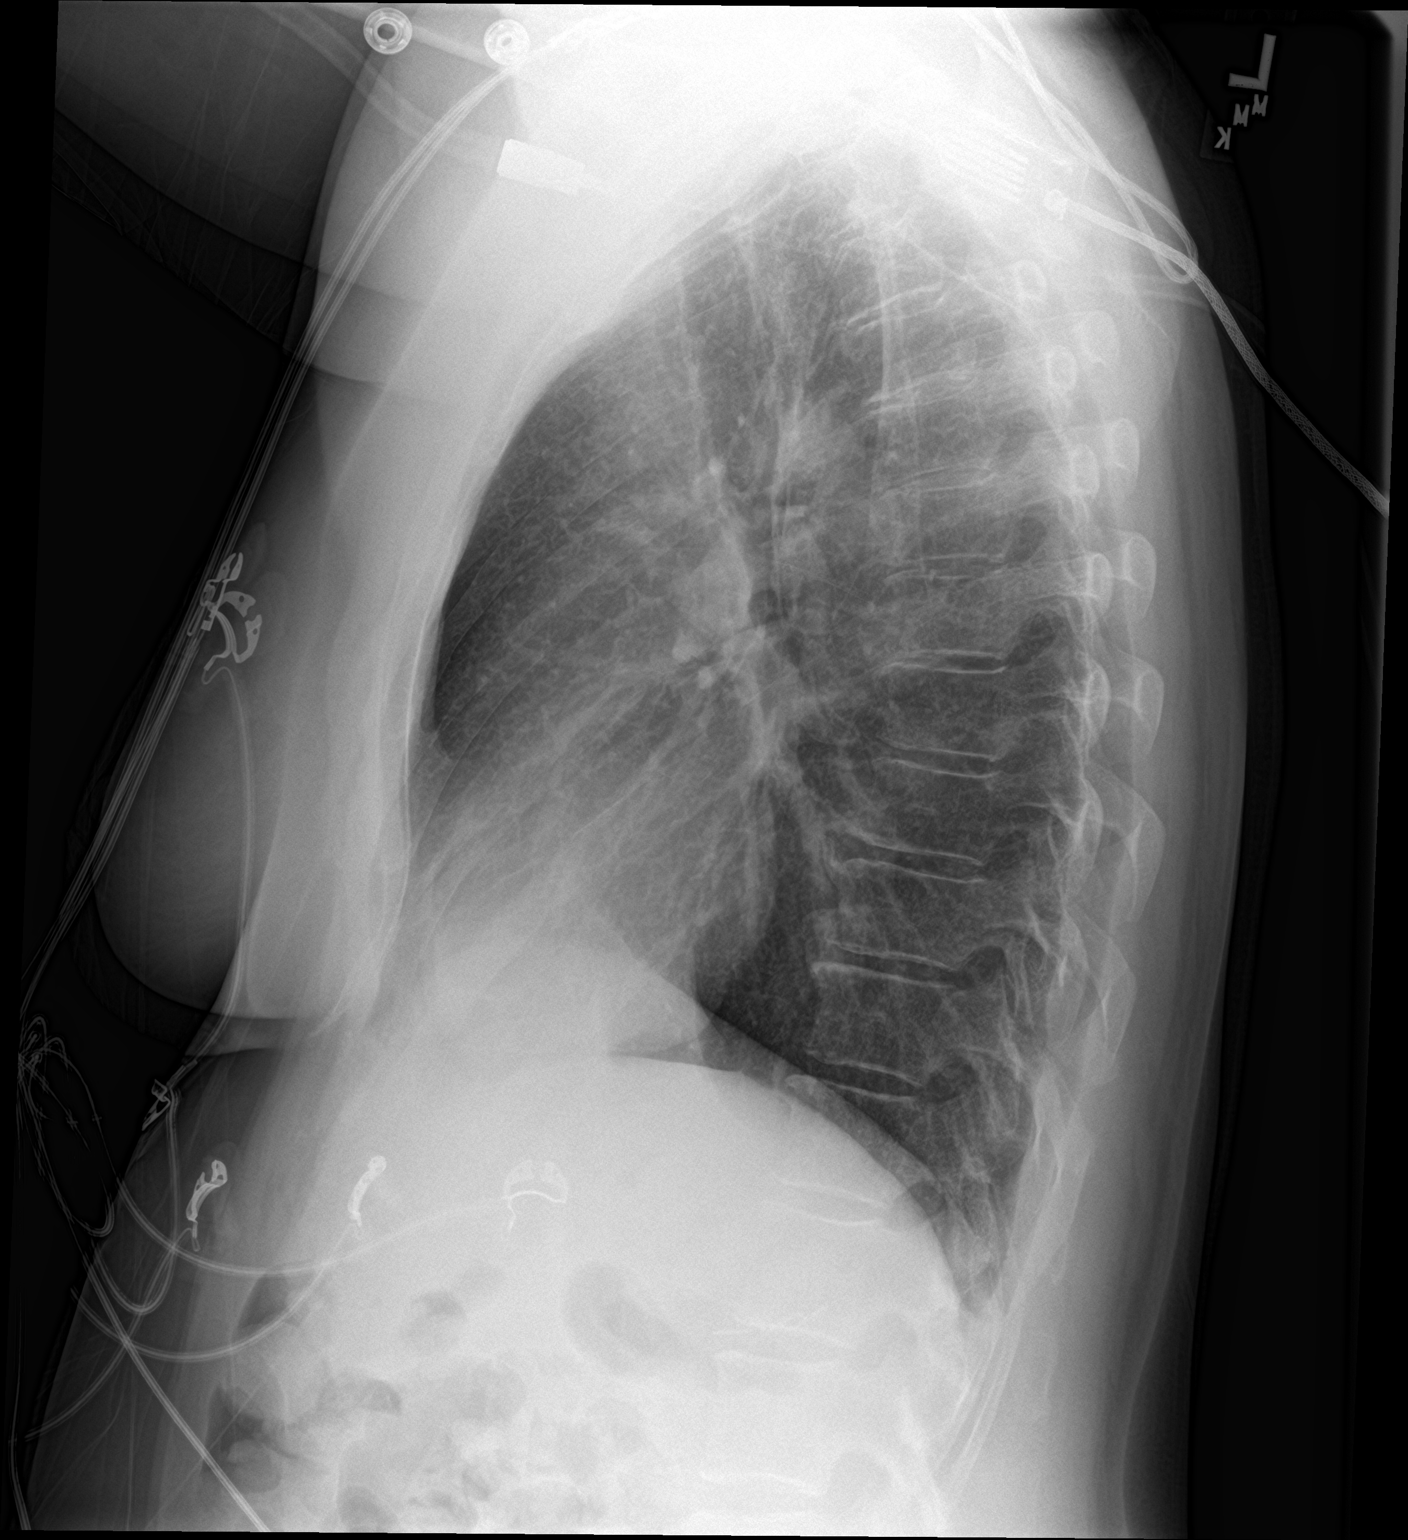

[2 of 2 positions shown; findings below may reference images not displayed]

FINDINGS: The heart size and mediastinal contours are within normal limits.
Both lungs are clear. The visualized skeletal structures are
unremarkable.
IMPRESSION: No active cardiopulmonary disease.

## 2020-04-14 IMAGING — US ULTRASOUND ABDOMEN LIMITED
1 series · 14 of 25 positions shown · non-contrast
Comparison: None.

CLINICAL DATA: Chest pain radiating to the back

EXAM:
ULTRASOUND ABDOMEN LIMITED RIGHT UPPER QUADRANT

[Series 1: ultrasound abdomen limited · 14 of 54 slices shown]
[im 1/54]
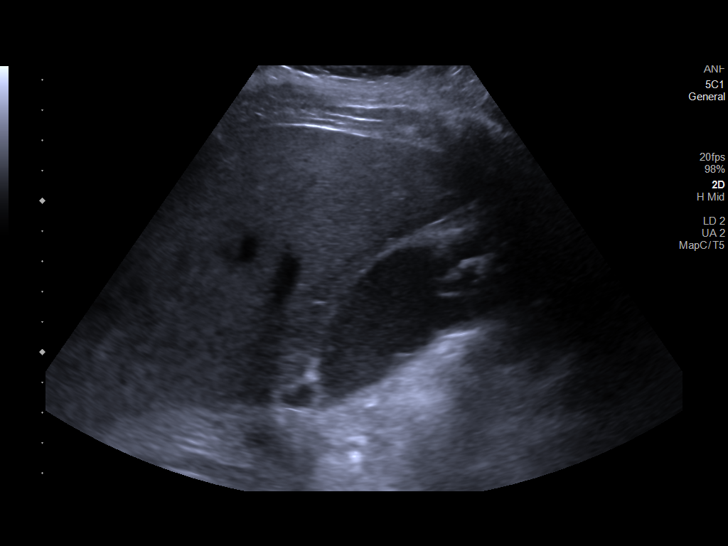
[im 5/54]
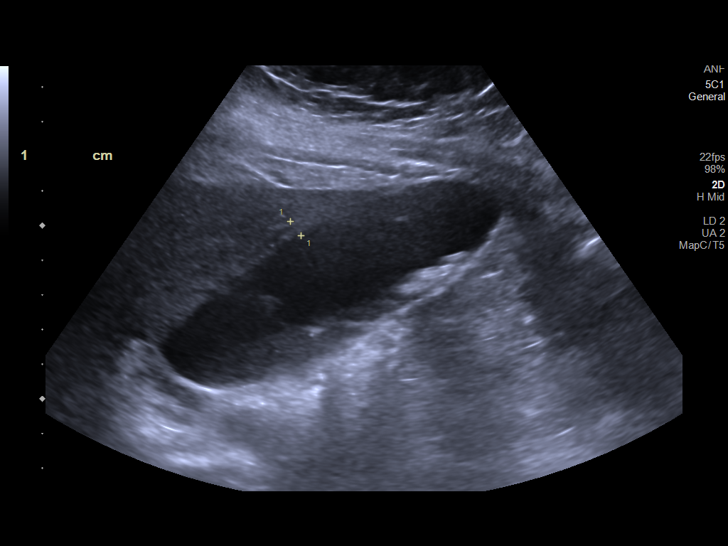
[im 9/54]
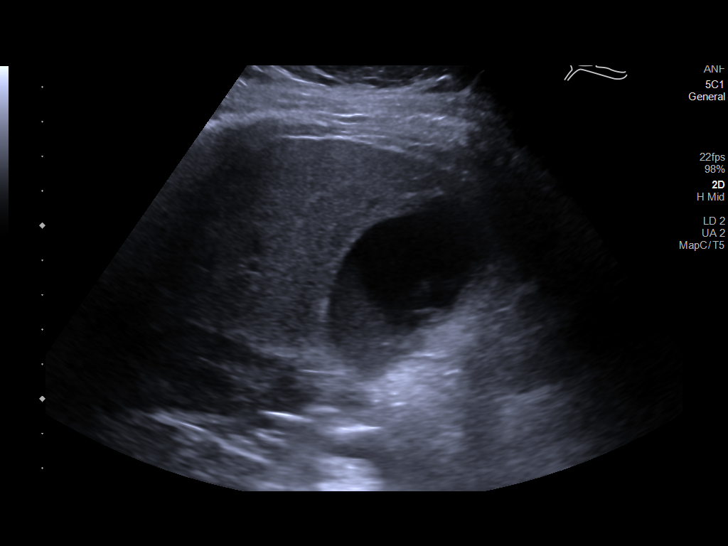
[im 14/54]
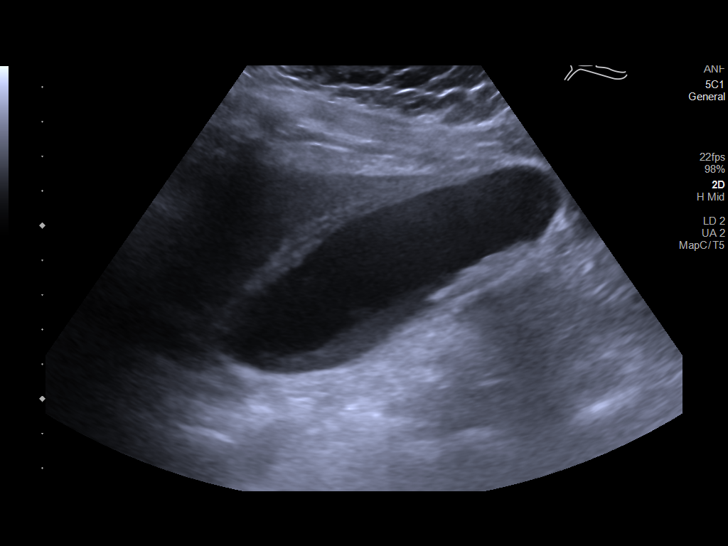
[im 18/54]
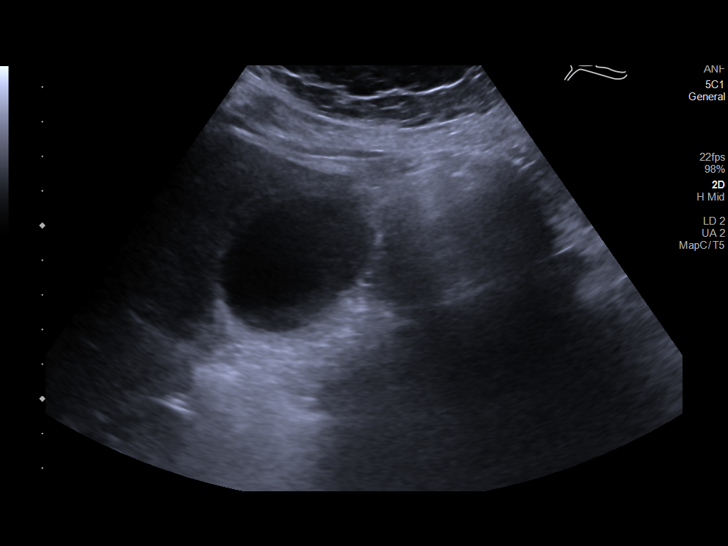
[im 20/54]
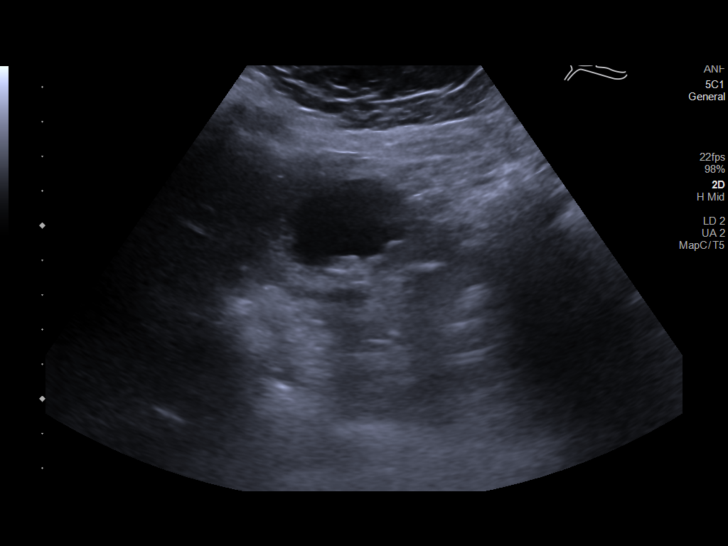
[im 25/54]
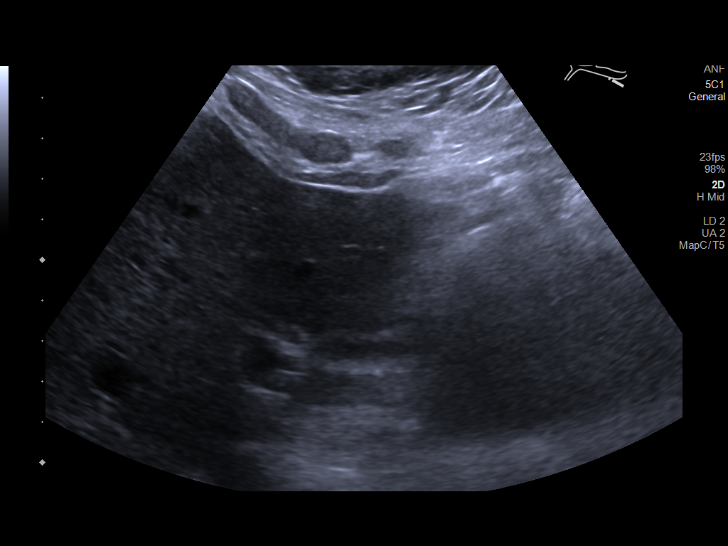
[im 29/54]
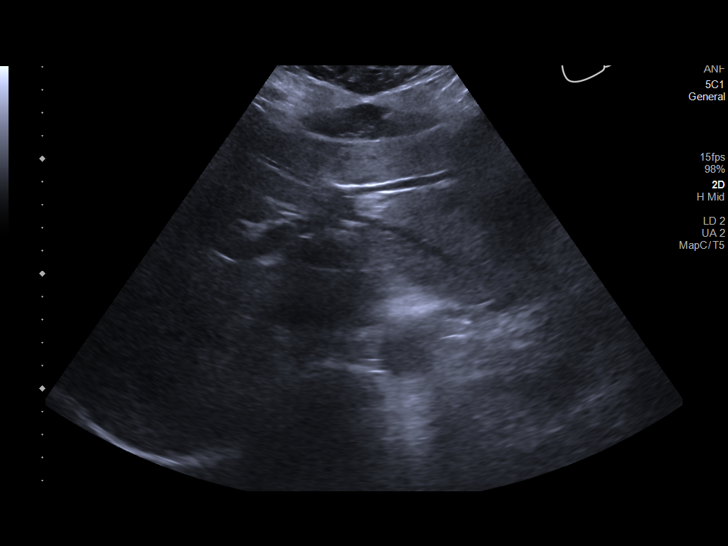
[im 34/54]
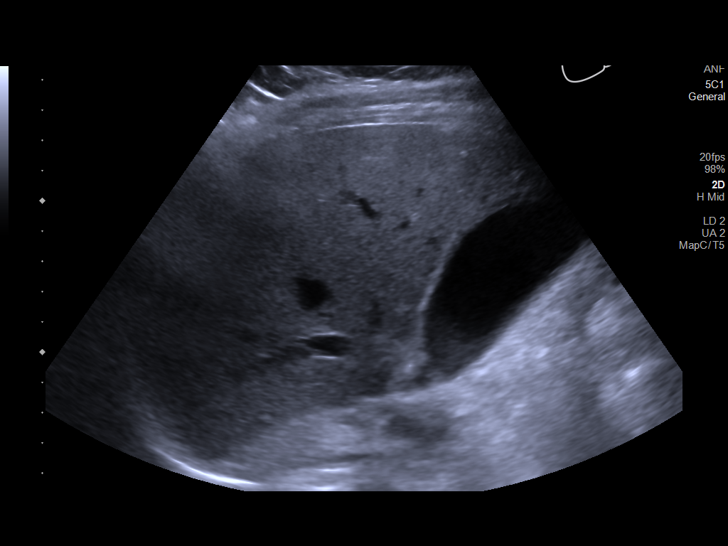
[im 36/54]
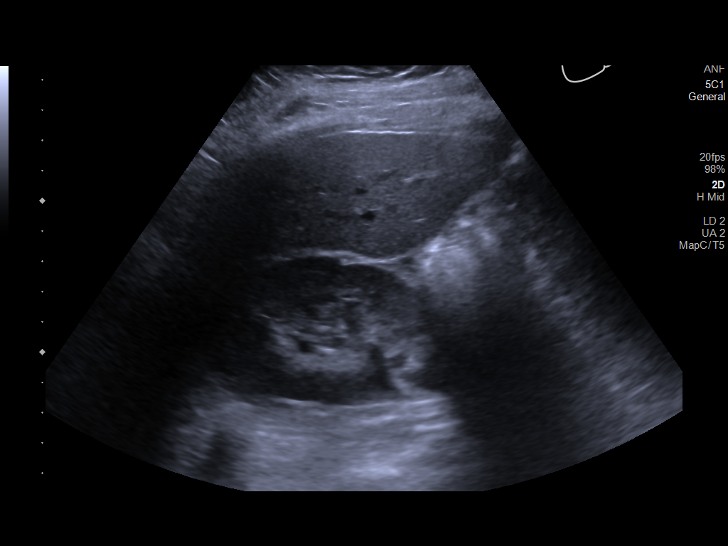
[im 40/54]
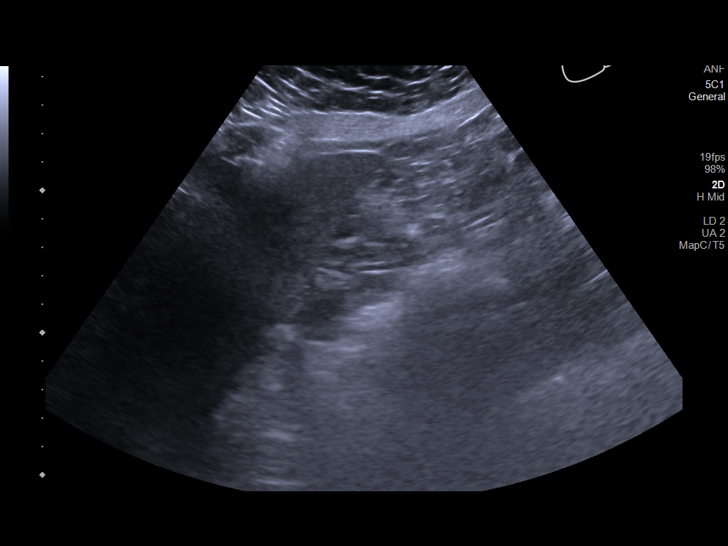
[im 45/54]
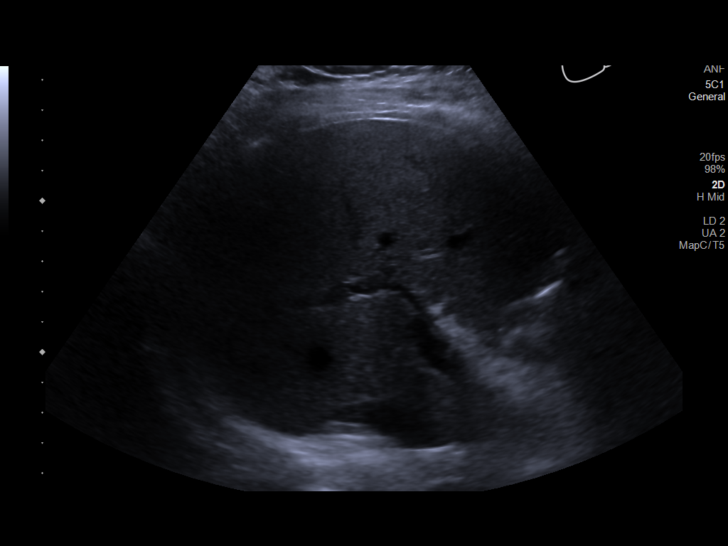
[im 49/54]
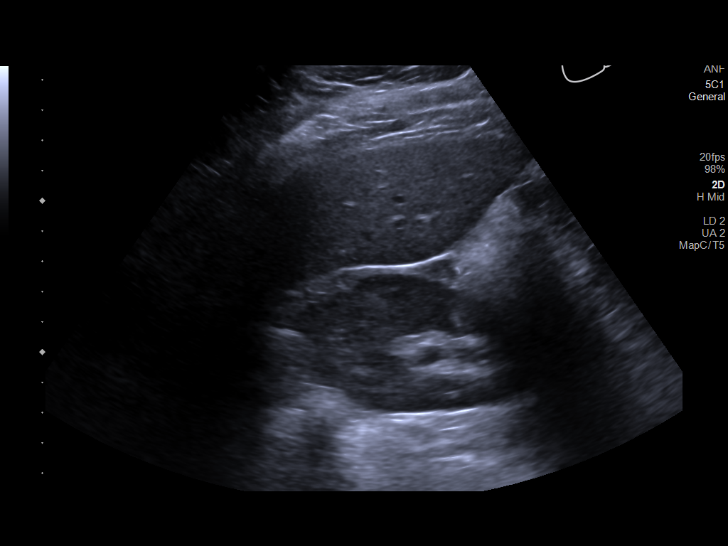
[im 54/54]
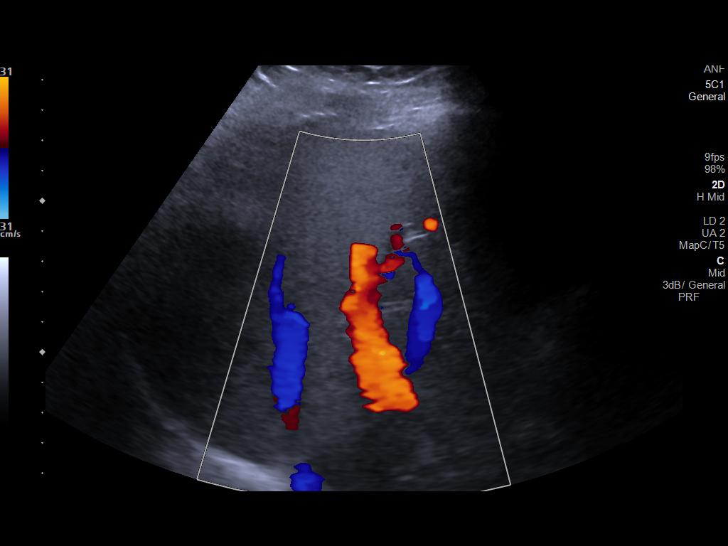

[14 of 25 positions shown; findings below may reference images not displayed]

FINDINGS: Gallbladder:

Cholelithiasis with the largest gallstone measuring 13 mm.
Gallbladder sludge. Gallbladder wall thickening measuring 9 mm.
Trace pericholecystic fluid. Positive sonographic Murphy sign.

Common bile duct:

Diameter: 4.6 mm

Liver:

No focal lesion identified. Increased hepatic parenchymal
echogenicity. Portal vein is patent on color Doppler imaging with
normal direction of blood flow towards the liver.
IMPRESSION: Cholelithiasis with findings most concerning for acute
cholecystitis.

## 2021-01-19 ENCOUNTER — Other Ambulatory Visit: Payer: Self-pay | Admitting: Obstetrics and Gynecology

## 2021-01-19 DIAGNOSIS — Z1231 Encounter for screening mammogram for malignant neoplasm of breast: Secondary | ICD-10-CM

## 2021-02-15 ENCOUNTER — Other Ambulatory Visit: Payer: Self-pay | Admitting: Family Medicine

## 2021-02-15 DIAGNOSIS — N63 Unspecified lump in unspecified breast: Secondary | ICD-10-CM

## 2021-02-23 DIAGNOSIS — Z1231 Encounter for screening mammogram for malignant neoplasm of breast: Secondary | ICD-10-CM

## 2021-02-25 ENCOUNTER — Ambulatory Visit
Admission: RE | Admit: 2021-02-25 | Discharge: 2021-02-25 | Disposition: A | Discharge status: Home / Self Care | Payer: BC Managed Care – PPO | Source: Ambulatory Visit | Attending: Family Medicine | Admitting: Family Medicine

## 2021-02-25 ENCOUNTER — Other Ambulatory Visit: Payer: Self-pay | Admitting: Family Medicine

## 2021-02-25 ENCOUNTER — Other Ambulatory Visit: Payer: Self-pay

## 2021-02-25 DIAGNOSIS — N63 Unspecified lump in unspecified breast: Secondary | ICD-10-CM

## 2021-02-25 DIAGNOSIS — N6489 Other specified disorders of breast: Secondary | ICD-10-CM

## 2021-03-03 ENCOUNTER — Other Ambulatory Visit: Payer: BC Managed Care – PPO

## 2021-08-10 ENCOUNTER — Emergency Department (HOSPITAL_COMMUNITY): Payer: BC Managed Care – PPO

## 2021-08-10 ENCOUNTER — Emergency Department (HOSPITAL_COMMUNITY)
Admission: EM | Admit: 2021-08-10 | Discharge: 2021-08-10 | Disposition: A | Discharge status: Home / Self Care | Payer: BC Managed Care – PPO | Attending: Emergency Medicine | Admitting: Emergency Medicine

## 2021-08-10 ENCOUNTER — Other Ambulatory Visit: Payer: Self-pay

## 2021-08-10 ENCOUNTER — Encounter (HOSPITAL_COMMUNITY): Payer: Self-pay | Admitting: Pharmacy Technician

## 2021-08-10 DIAGNOSIS — Z20822 Contact with and (suspected) exposure to covid-19: Secondary | ICD-10-CM | POA: Insufficient documentation

## 2021-08-10 DIAGNOSIS — R509 Fever, unspecified: Secondary | ICD-10-CM | POA: Diagnosis not present

## 2021-08-10 DIAGNOSIS — M546 Pain in thoracic spine: Secondary | ICD-10-CM | POA: Insufficient documentation

## 2021-08-10 DIAGNOSIS — R051 Acute cough: Secondary | ICD-10-CM | POA: Diagnosis not present

## 2021-08-10 DIAGNOSIS — I10 Essential (primary) hypertension: Secondary | ICD-10-CM | POA: Insufficient documentation

## 2021-08-10 DIAGNOSIS — R0602 Shortness of breath: Secondary | ICD-10-CM | POA: Insufficient documentation

## 2021-08-10 DIAGNOSIS — R059 Cough, unspecified: Secondary | ICD-10-CM | POA: Diagnosis present

## 2021-08-10 HISTORY — DX: Disorder of kidney and ureter, unspecified: N28.9

## 2021-08-10 LAB — CBC WITH DIFFERENTIAL/PLATELET
Abs Immature Granulocytes: 0.05 10*3/uL (ref 0.00–0.07)
Basophils Absolute: 0.1 10*3/uL (ref 0.0–0.1)
Basophils Relative: 1 %
Eosinophils Absolute: 0.3 10*3/uL (ref 0.0–0.5)
Eosinophils Relative: 2 %
HCT: 44.6 % (ref 36.0–46.0)
Hemoglobin: 14.4 g/dL (ref 12.0–15.0)
Immature Granulocytes: 0 %
Lymphocytes Relative: 21 %
Lymphs Abs: 3 10*3/uL (ref 0.7–4.0)
MCH: 31.3 pg (ref 26.0–34.0)
MCHC: 32.3 g/dL (ref 30.0–36.0)
MCV: 97 fL (ref 80.0–100.0)
Monocytes Absolute: 1 10*3/uL (ref 0.1–1.0)
Monocytes Relative: 7 %
Neutro Abs: 9.6 10*3/uL — ABNORMAL HIGH (ref 1.7–7.7)
Neutrophils Relative %: 69 %
Platelets: 355 10*3/uL (ref 150–400)
RBC: 4.6 MIL/uL (ref 3.87–5.11)
RDW: 13.3 % (ref 11.5–15.5)
WBC: 14.1 10*3/uL — ABNORMAL HIGH (ref 4.0–10.5)
nRBC: 0 % (ref 0.0–0.2)

## 2021-08-10 LAB — URINALYSIS, ROUTINE W REFLEX MICROSCOPIC
Bilirubin Urine: NEGATIVE
Glucose, UA: NEGATIVE mg/dL
Hgb urine dipstick: NEGATIVE
Ketones, ur: 20 mg/dL — AB
Leukocytes,Ua: NEGATIVE
Nitrite: NEGATIVE
Protein, ur: NEGATIVE mg/dL
Specific Gravity, Urine: >1.046 — ABNORMAL HIGH (ref 1.005–1.030)
pH: 5 (ref 5.0–8.0)

## 2021-08-10 LAB — RESP PANEL BY RT-PCR (FLU A&B, COVID) ARPGX2
Influenza A by PCR: NEGATIVE
Influenza B by PCR: NEGATIVE
SARS Coronavirus 2 by RT PCR: NEGATIVE

## 2021-08-10 LAB — BASIC METABOLIC PANEL
Anion gap: 12 (ref 5–15)
BUN: 11 mg/dL (ref 6–20)
CO2: 22 mmol/L (ref 22–32)
Calcium: 9.4 mg/dL (ref 8.9–10.3)
Chloride: 104 mmol/L (ref 98–111)
Creatinine, Ser: 1.03 mg/dL — ABNORMAL HIGH (ref 0.44–1.00)
GFR, Estimated: >60 mL/min (ref >60)
Glucose, Bld: 94 mg/dL (ref 70–99)
Potassium: 4.4 mmol/L (ref 3.5–5.1)
Sodium: 138 mmol/L (ref 135–145)

## 2021-08-10 LAB — TROPONIN I (HIGH SENSITIVITY)
Troponin I (High Sensitivity): 6 ng/L (ref <18)
Troponin I (High Sensitivity): 4 ng/L (ref <18)

## 2021-08-10 LAB — BRAIN NATRIURETIC PEPTIDE: B Natriuretic Peptide: 18.3 pg/mL (ref 0.0–100.0)

## 2021-08-10 MED ORDER — IOHEXOL 350 MG/ML SOLN
55.0000 mL | Freq: Once | INTRAVENOUS | Status: AC | PRN
  Administered 2021-08-10: 55 mL via INTRAVENOUS

## 2021-08-10 MED ORDER — LACTATED RINGERS IV BOLUS
1000.0000 mL | Freq: Once | INTRAVENOUS | Status: AC
  Administered 2021-08-10: 1000 mL via INTRAVENOUS

## 2021-08-10 MED ORDER — AZITHROMYCIN 250 MG PO TABS: 250.0000 mg | ORAL_TABLET | Freq: Every day | ORAL | 0 refills | Status: AC

## 2021-08-10 MED ORDER — MORPHINE SULFATE (PF) 4 MG/ML IV SOLN
4.0000 mg | Freq: Once | INTRAVENOUS | Status: AC
  Administered 2021-08-10: 4 mg via INTRAVENOUS
  Filled 2021-08-10: qty 1

## 2021-08-10 NOTE — ED Triage Notes (Signed)
Pt here with reports of coughing up blood, R thoracic back pain, elevated heart rate. Pt with recent travel. Concerned for PE.  ?

## 2021-08-10 NOTE — ED Notes (Signed)
Patient verbalizes understanding of d/c instructions. Opportunities for questions and answers were provided. Pt d/c from ED and ambulated to lobby with husband.  

## 2021-08-10 NOTE — ED Provider Triage Note (Signed)
Emergency Medicine Provider Triage Evaluation Note ? ?Donna Glass , a 50 y.o. female  was evaluated in triage.  She was sent here by her doctor.  There is concern for pulmonary embolism.  Patient recently traveled and got back on Sunday.  She started noticing a worsening cough and shortness of breath and dizziness.  She has had hemoptysis that is progressively worsening.  She also states that she developed right-sided mid back pain that is worse with deep breaths.  She has a history of estrogen use, recent travel, smoking history.  She has been tachycardic here.  She had a chest x-ray done at her doctor's office which was negative. ?Review of Systems  ?Positive: Back pain, shortness of breath, hemoptysis, dizziness ?Negative:  ? ?Physical Exam  ?BP (!) 153/84 (BP Location: Right Arm)   Pulse (!) 107   Temp 98.7 ?F (37.1 ?C) (Oral)   Resp (!) 22   LMP 05/27/2013   SpO2 99%  ?Gen:   Awake, no distress   ?Resp:  Normal effort  ?MSK:   Moves extremities without difficulty  ?Other:  Lungs Clear. Slightly dypneic at rest.  ? ?Medical Decision Making  ?Medically screening exam initiated at 3:56 PM.  Appropriate orders placed.  Donna Glass was informed that the remainder of the evaluation will be completed by another provider, this initial triage assessment does not replace that evaluation, and the importance of remaining in the ED until their evaluation is complete. ? ?She is tachycardic, + hemoptysis, multiple PE risk factors, symptoms not explained by other cause as CXR negative for PNA. Wells score is 5.5. Will go ahead and proceed with CTA chest for PE r/o ?  ?Claudie Leach, PA-C ?08/10/21 1559 ? ?

## 2021-08-10 NOTE — ED Provider Notes (Signed)
?[facility] EMERGENCY DEPARTMENT ?Provider Note ? ? ?CSN: [id number] ?Arrival date & time: 08/10/21  1521 ? ?  ? ?History ? ?Chief Complaint  ?Patient presents with  ? Cough  ? Chest Pain  ? ? ?Donna Glass is a 51 y.o. female. ? ? ?Cough ?Associated symptoms: fever and shortness of breath   ?Chest Pain ?Associated symptoms: back pain, cough, fever and shortness of breath   ?Patient presenting for 3 days of cough.  She also has had pain on the right side of her back that is worsened with coughing and deep inspiration.  She had a recent travel and endorses some swollen legs following that trip.  Leg swelling has improved.  She has had a subjective fevers at home.  She is on exogenous estrogen.  She was seen in urgent care and sent to the emergency department due to concern of PE.  Her medical history includes anemia, HTN.  She has a right solitary kidney.  3 days ago, she felt pain in her back.  This was also the day she returned from her trip.  She felt like it was probably just from positioning during travel.  This pain has worsened.  She has been concerned of kidney infection. ?  ? ?Home Medications ?Prior to Admission medications   ?Medication Sig Start Date End Date Taking? Authorizing Provider  ?azithromycin (ZITHROMAX Z-PAK) 250 MG tablet Take 1 tablet (250 mg total) by mouth daily. Take 2 tablets on day 1, and 1 tablet daily for the next 4 days. 08/10/21  Yes Gloris Manchester, MD  ?acetaminophen (TYLENOL) 500 MG tablet Take 1,000 mg by mouth every 6 (six) hours as needed for mild pain.    [provider]  ?anti-nausea (EMETROL) solution Take 10 mLs by mouth every 15 (fifteen) minutes as needed for nausea or vomiting.    [provider]  ?bismuth subsalicylate (PEPTO BISMOL) 262 MG chewable tablet Chew 524 mg by mouth as needed for indigestion.    [provider]  ?buPROPion (WELLBUTRIN XL) 300 MG 24 hr tablet Take 300 mg by mouth daily. 07/30/18   [provider]  ?diphenhydrAMINE (BENADRYL) 25 MG tablet Take 25 mg by mouth as needed for allergies.    [provider]  ?DUAVEE 0.45-20 MG TABS Take 1 tablet by mouth daily. 09/05/18   [provider]  ?oxyCODONE (OXY IR/ROXICODONE) 5 MG immediate release tablet Take 1 tablet (5 mg total) by mouth every 4 (four) hours as needed for moderate pain. 10/09/18   Barnetta Chapel, PA-C  ?Phentermine HCl 8 MG TABS Take 8 mg by mouth daily. 08/07/18   [provider]  ?psyllium (METAMUCIL SMOOTH TEXTURE) 28 % packet Take 1 packet by mouth daily as needed (constipation).    [provider]  ?topiramate (TOPAMAX) 25 MG tablet Take 25 mg by mouth daily. 09/26/18   [provider]  ?zolpidem (AMBIEN) 10 MG tablet Take 10 mg by mouth at bedtime as needed for sleep. 09/28/18   [provider]  ?   ? ?Allergies    ?Trimethoprim, Amoxicillin, Sulfamethoxazole, and Codeine   ? ?Review of Systems   ?Review of Systems  ?Constitutional:  Positive for fever.  ?Respiratory:  Positive for cough and shortness of breath.   ?Musculoskeletal:  Positive for back pain.  ?All other systems reviewed and are negative. ? ?Physical Exam ?Updated Vital Signs ?BP (!) 139/99   Pulse 73   Temp 98.1 ?F (36.7 ?C)  Resp (!) 21   LMP 05/27/2013   SpO2 100%  ?Physical Exam ?Vitals and nursing note reviewed.  ?Constitutional:   ?   General: She is not in acute distress. ?   Appearance: She is well-developed and normal weight. She is not ill-appearing, toxic-appearing or diaphoretic.  ?HENT:  ?   Head: Normocephalic and atraumatic.  ?Eyes:  ?   Extraocular Movements: Extraocular movements intact.  ?   Conjunctiva/sclera: Conjunctivae normal.  ?Cardiovascular:  ?   Rate and Rhythm: Normal rate and regular rhythm.  ?   Heart sounds: No murmur heard. ?Pulmonary:  ?   Effort: Pulmonary effort is normal. No tachypnea or respiratory distress.  ?   Breath sounds: Normal breath sounds. No decreased breath sounds,  wheezing, rhonchi or rales.  ?Chest:  ?   Chest wall: No tenderness.  ?Abdominal:  ?   Palpations: Abdomen is soft.  ?   Tenderness: There is no abdominal tenderness. There is left CVA tenderness.  ?   Comments: Tenderness to area of right lower posterior chest wall/CVA  ?Musculoskeletal:     ?   General: No swelling.  ?   Cervical back: Normal range of motion and neck supple.  ?   Right lower leg: No edema.  ?   Left lower leg: No edema.  ?Skin: ?   General: Skin is warm and dry.  ?Neurological:  ?   General: No focal deficit present.  ?   Mental Status: She is alert and oriented to person, place, and time.  ?Psychiatric:     ?   Mood and Affect: Mood normal.     ?   Behavior: Behavior normal.  ? ? ?ED Results / Procedures / Treatments   ?Labs ?(all labs ordered are listed, but only abnormal results are displayed) ?Labs Reviewed  ?BASIC METABOLIC PANEL - Abnormal; Notable for the following components:  ?    Result Value  ? Creatinine, Ser 1.03 (*)   ? All other components within normal limits  ?CBC WITH DIFFERENTIAL/PLATELET - Abnormal; Notable for the following components:  ? WBC 14.1 (*)   ? Neutro Abs 9.6 (*)   ? All other components within normal limits  ?URINALYSIS, ROUTINE W REFLEX MICROSCOPIC - Abnormal; Notable for the following components:  ? Specific Gravity, Urine >1.046 (*)   ? Ketones, ur 20 (*)   ? All other components within normal limits  ?RESP PANEL BY RT-PCR (FLU A&B, COVID) [id number]  ?URINE CULTURE  ?BRAIN NATRIURETIC PEPTIDE  ?TROPONIN I (HIGH SENSITIVITY)  ?TROPONIN I (HIGH SENSITIVITY)  ? ? ?EKG ?EKG Interpretation ? ?Date/Time:  Tuesday August 10 2021 15:42:57 EDT ?Ventricular Rate:  108 ?PR Interval:  152 ?QRS Duration: 70 ?QT Interval:  334 ?QTC Calculation: 447 ?R Axis:   10 ?Text Interpretation: Sinus tachycardia Otherwise normal ECG Confirmed by Godfrey Pick [phone]) on 08/10/2021 10:34:28 PM ? ?Radiology ?DG Chest 2 View ? ?Result Date: 08/10/2021 ?CLINICAL DATA:  Shortness of breath,  hemoptysis, chest pain EXAM: CHEST - 2 VIEW COMPARISON:  10/08/2018 FINDINGS: Cardiac size is within normal limits. There are no signs of pulmonary edema. New small patchy infiltrate is seen in the lateral aspect of left lower lung fields at the cardiophrenic angle. There is minimal blunting of left lateral CP angle. There is no pneumothorax. IMPRESSION: Small infiltrate in the lateral aspect of left lower lung fields may suggest atelectasis/pneumonia. Small left pleural effusion. Electronically Signed   By: Elmer Picker M.D.   On: 08/10/2021  16:16  ? ?CT Angio Chest PE W/Cm &/Or Wo Cm ? ?Result Date: 08/10/2021 ?CLINICAL DATA:  Hemoptysis, back pain EXAM: CT ANGIOGRAPHY CHEST WITH CONTRAST TECHNIQUE: Multidetector CT imaging of the chest was performed using the standard protocol during bolus administration of intravenous contrast. Multiplanar CT image reconstructions and MIPs were obtained to evaluate the vascular anatomy. RADIATION DOSE REDUCTION: This exam was performed according to the departmental dose-optimization program which includes automated exposure control, adjustment of the mA and/or kV according to patient size and/or use of iterative reconstruction technique. CONTRAST:  15mL OMNIPAQUE IOHEXOL 350 MG/ML SOLN COMPARISON:  Chest radiograph done earlier today FINDINGS: Cardiovascular: There is homogeneous enhancement in the thoracic aorta. There are no intraluminal filling defects in the pulmonary artery branches. Mediastinum/Nodes: No significant lymphadenopathy seen. Lungs/Pleura: There is no focal pulmonary consolidation. There are linear densities in the both lower lung fields. There is no pleural effusion or pneumothorax. Upper Abdomen: Liver appears to be enlarged in size. Musculoskeletal: Unremarkable. Review of the MIP images confirms the above findings. IMPRESSION: There is no evidence of pulmonary artery embolism. There is no evidence of thoracic aortic dissection. Small linear densities  seen in both lower lung fields suggesting subsegmental atelectasis. Less likely possibility would be early pneumonia. There is no pleural effusion. Electronically Signed   By: Elmer Picker M.D.   On: 08/10/2021 19:26

## 2021-08-13 LAB — URINE CULTURE: Culture: >=100000 — AB

## 2021-08-14 ENCOUNTER — Telehealth (HOSPITAL_BASED_OUTPATIENT_CLINIC_OR_DEPARTMENT_OTHER): Payer: Self-pay | Admitting: *Deleted

## 2021-08-14 NOTE — Telephone Encounter (Signed)
Post ED Visit - Positive Culture Follow-up ? ?Culture report reviewed by antimicrobial stewardship pharmacist: ?[facility] ?[x]  Pharm.D. ?[]  Daylene Posey, Pharm.D., BCPS AQ-ID ?[]  , Pharm.D., BCPS ?[]  Celedonio Miyamoto, Pharm.D., BCPS ?[]  [place], Garvin Fila.D., BCPS, AAHIVP ?[]  , Pharm.D., BCPS, AAHIVP ?[]  Georgina Pillion, PharmD, BCPS ?[]  , PharmD, BCPS ?[]  [place], PharmD, BCPS ?[]  [address], PharmD ?[]  , PharmD, BCPS ?[]  Estella Husk, PharmD ? ? [facility] ?[]  Lysle Pearl, PharmD ?[]  , PharmD ?[]  Phillips Climes, PharmD ?[]  , Rph ?[]  Agapito Games) , PharmD ?[]  Verlan Friends, PharmD ?[]  , PharmD ?[]  Mervyn Gay, PharmD ?[]  , PharmD ?[]  Vinnie Level, PharmD ?[]  Gerri Spore, PharmD ?[]  , PharmD ?[]  Len Childs, PharmD ? ? ?Positive urine culture ?Treated with Azithromycin,Patient not having any urinary symptoms at this time, aand no further patient follow-up is required at this time. ? ? ?08/14/2021, 11:34 AM ?  ?

## 2021-08-14 NOTE — Progress Notes (Signed)
ED Antimicrobial Stewardship Positive Culture Follow Up  ? ?Donna Glass is an 51 y.o. female who presented to [facility] on 08/10/2021 with a chief complaint of  ?Chief Complaint  ?Patient presents with  ? Cough  ? Chest Pain  ? ? ?Recent Results (from the past 720 hour(s))  ?Urine Culture     Status: Abnormal  ? Collection Time: 08/10/21  3:21 PM  ? Specimen: Urine, Clean Catch  ?Result Value Ref Range Status  ? Specimen Description URINE, CLEAN CATCH  Final  ? Special Requests   Final  ?  NONE ?Performed at [facility], [address] [place], [place] [address] ?  ? Culture >=100,000 COLONIES/mL ESCHERICHIA COLI (A)  Final  ? Report Status 08/13/2021 FINAL  Final  ? Organism ID, Bacteria ESCHERICHIA COLI (A)  Final  ?    Susceptibility  ? Escherichia coli - MIC*  ?  AMPICILLIN 4 SENSITIVE Sensitive   ?  CEFAZOLIN <=4 SENSITIVE Sensitive   ?  CEFEPIME <=0.12 SENSITIVE Sensitive   ?  CEFTRIAXONE <=0.25 SENSITIVE Sensitive   ?  CIPROFLOXACIN <=0.25 SENSITIVE Sensitive   ?  GENTAMICIN <=1 SENSITIVE Sensitive   ?  IMIPENEM <=0.25 SENSITIVE Sensitive   ?  NITROFURANTOIN <=16 SENSITIVE Sensitive   ?  TRIMETH/SULFA <=20 SENSITIVE Sensitive   ?  AMPICILLIN/SULBACTAM <=2 SENSITIVE Sensitive   ?  PIP/TAZO <=4 SENSITIVE Sensitive   ?  * >=100,000 COLONIES/mL ESCHERICHIA COLI  ?Resp Panel by RT-PCR (Flu A&B, Covid) Nasopharyngeal Swab     Status: None  ? Collection Time: 08/10/21  3:53 PM  ? Specimen: Nasopharyngeal Swab; Nasopharyngeal(NP) swabs in vial transport medium  ?Result Value Ref Range Status  ? SARS Coronavirus 2 by RT PCR NEGATIVE NEGATIVE Final  ?  Comment: (NOTE) ?SARS-CoV-2 target nucleic acids are NOT DETECTED. ? ?The SARS-CoV-2 RNA is generally detectable in upper respiratory ?specimens during the acute phase of infection. The lowest ?concentration of SARS-CoV-2 viral copies this assay can detect is ?138 copies/mL. A negative result does not preclude SARS-Cov-2 ?infection and should not be used  as the sole basis for treatment or ?other patient management decisions. A negative result may occur with  ?improper specimen collection/handling, submission of specimen other ?than nasopharyngeal swab, presence of viral mutation(s) within the ?areas targeted by this assay, and inadequate number of viral ?copies(<138 copies/mL). A negative result must be combined with ?clinical observations, patient history, and epidemiological ?information. The expected result is Negative. ? ?Fact Sheet for Patients:  ?BloggerCourse.com ? ?Fact Sheet for Healthcare Providers:  ?SeriousBroker.it ? ?This test is no t yet approved or cleared by the [place] FDA and  ?has been authorized for detection and/or diagnosis of SARS-CoV-2 by ?FDA under an Emergency Use Authorization (EUA). This EUA will remain  ?in effect (meaning this test can be used) for the duration of the ?COVID-19 declaration under Section 564(b)(1) of the Act, 21 ?U.S.C.section 360bbb-3(b)(1), unless the authorization is terminated  ?or revoked sooner.  ? ? ?  ? Influenza A by PCR NEGATIVE NEGATIVE Final  ? Influenza B by PCR NEGATIVE NEGATIVE Final  ?  Comment: (NOTE) ?The Xpert Xpress SARS-CoV-2/FLU/RSV plus assay is intended as an aid ?in the diagnosis of influenza from Nasopharyngeal swab specimens and ?should not be used as a sole basis for treatment. Nasal washings and ?aspirates are unacceptable for Xpert Xpress SARS-CoV-2/FLU/RSV ?testing. ? ?Fact Sheet for Patients: ?BloggerCourse.com ? ?Fact Sheet for Healthcare Providers: ?SeriousBroker.it ? ?This test is not  yet approved or cleared by the [place] and ?has been authorized for detection and/or diagnosis of SARS-CoV-2 by ?FDA under an Emergency Use Authorization (EUA). This EUA will remain ?in effect (meaning this test can be used) for the duration of the ?COVID-19 declaration under Section 564(b)(1)  of the Act, 21 U.S.C. ?section 360bbb-3(b)(1), unless the authorization is terminated or ?revoked. ? ?Performed at [facility], [address] [place], [place] ?[address] ?  ? ? ?Plan: Sx check, if having sx give Macrobid 100 mg BID x 5d ? ?ED Provider: Arthor Captain, PA-C ? ? ?Donna Glass ?08/14/2021, 10:27 AM ?Clinical Pharmacist ?Monday - Friday phone -  [phone] ?Saturday - Sunday phone - [phone] ? ?

## 2021-08-20 IMAGING — MG DIGITAL SCREENING BILAT W/ TOMO W/ CAD
8 series · 9 of 24 positions shown · non-contrast
Comparison: Previous exam(s).

CLINICAL DATA: Screening.

EXAM:
DIGITAL SCREENING BILATERAL MAMMOGRAM WITH TOMO AND CAD

[L MLO synth-2D]
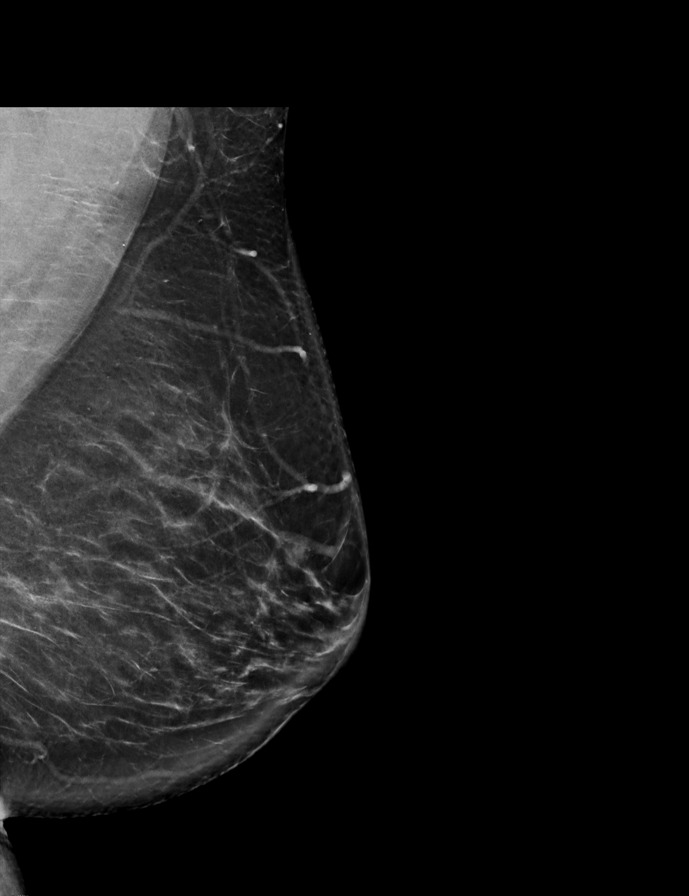

[R CC synth-2D]
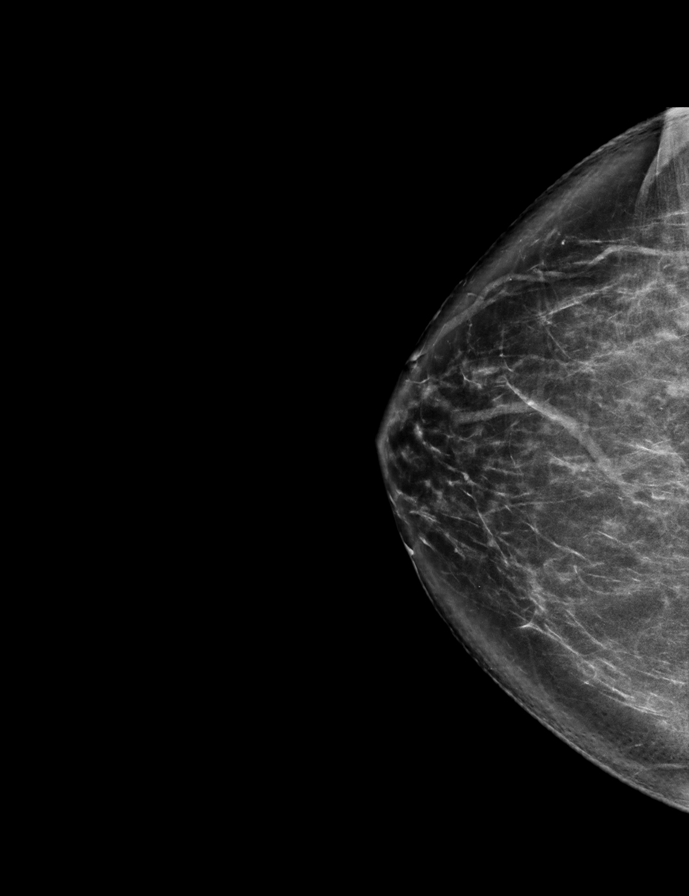

[R MLO synth-2D]
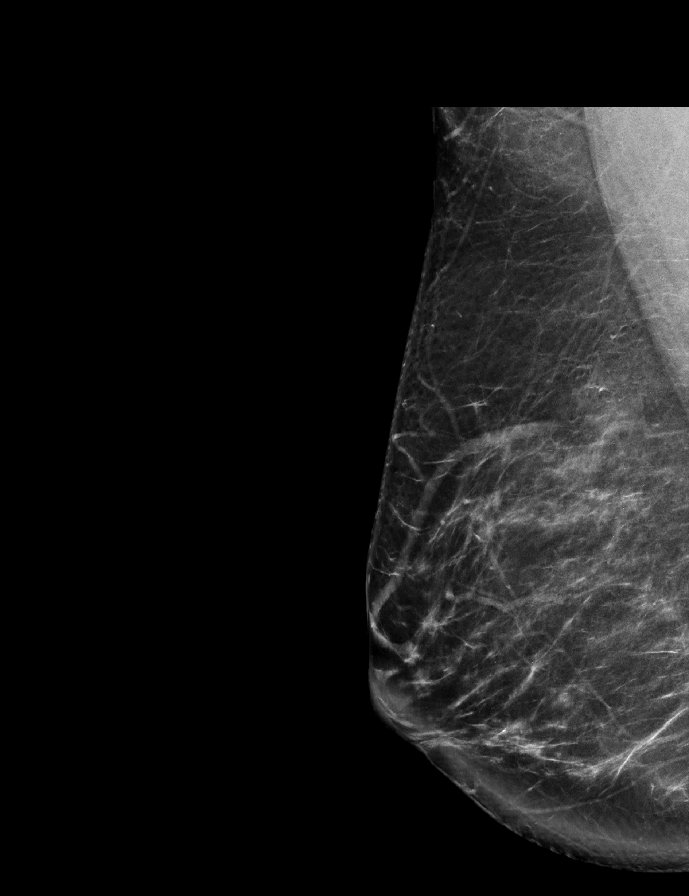

[L CC synth-2D]
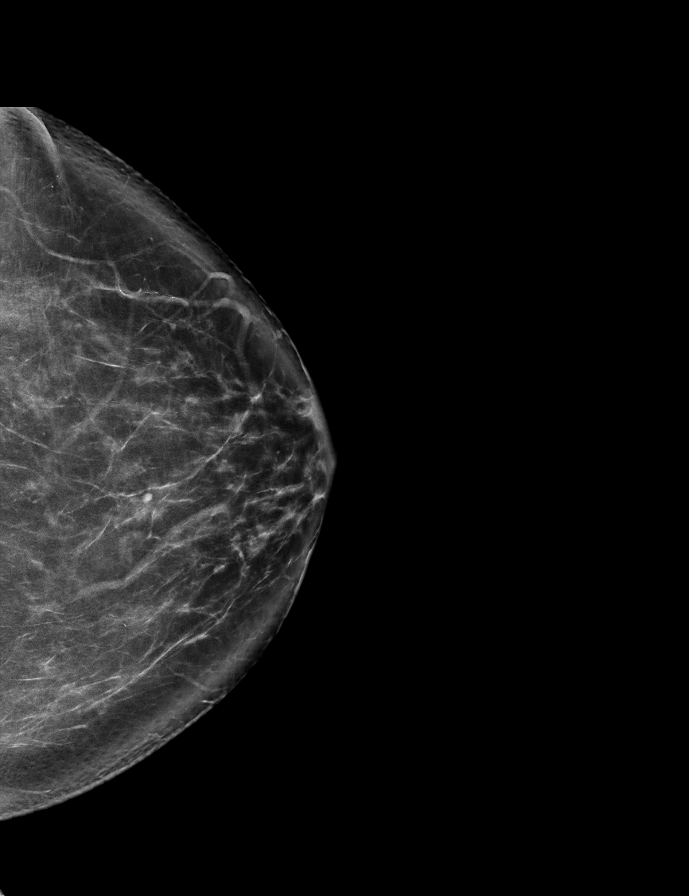

[L MLO tomo · 2 of 86 frames shown]
[frame 28/86]
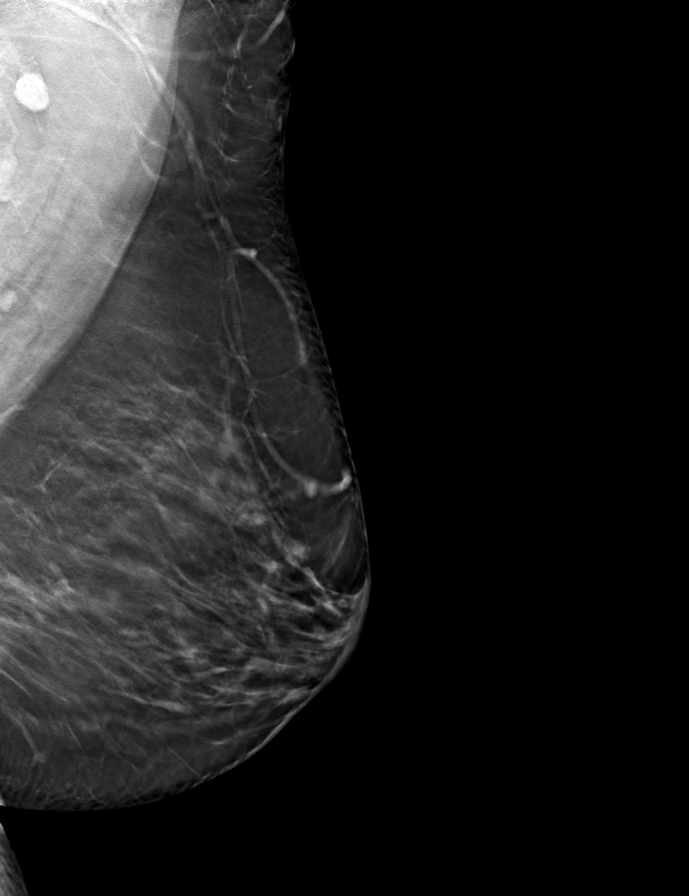
[frame 43/86]
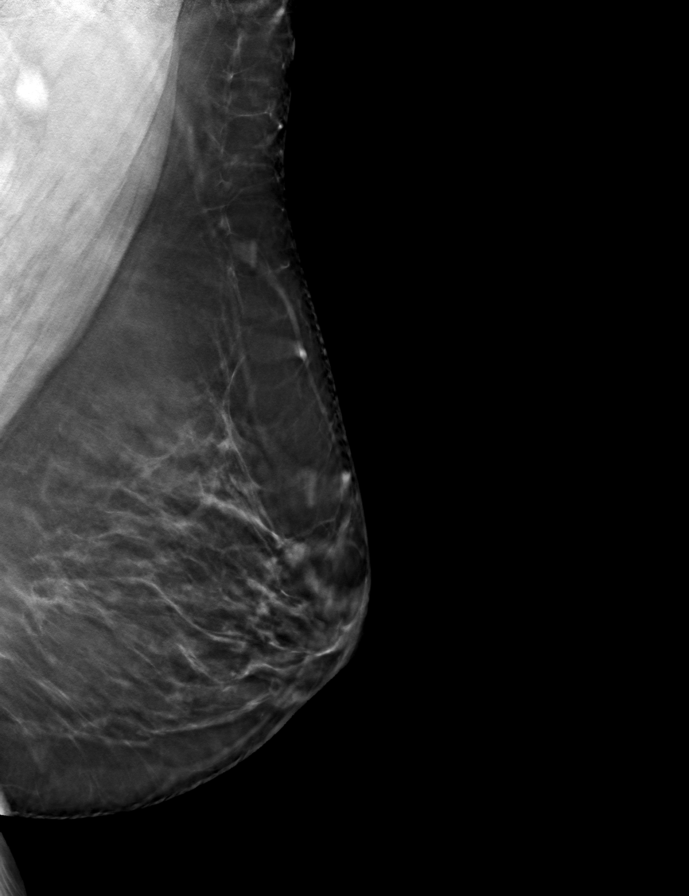

[L CC tomo · tomo slice 39/77.0]
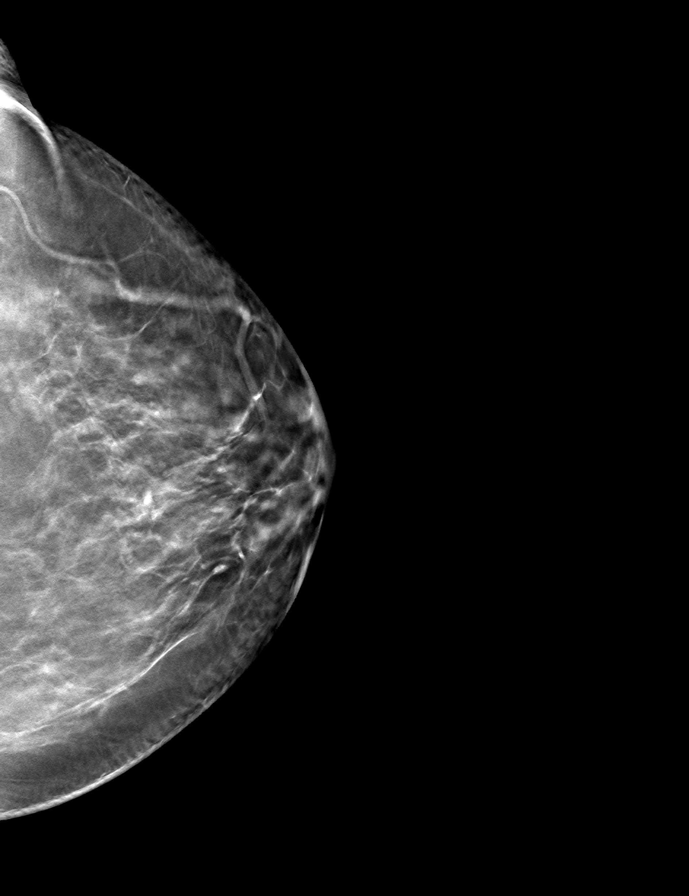

[R CC tomo · tomo slice 38/75.0]
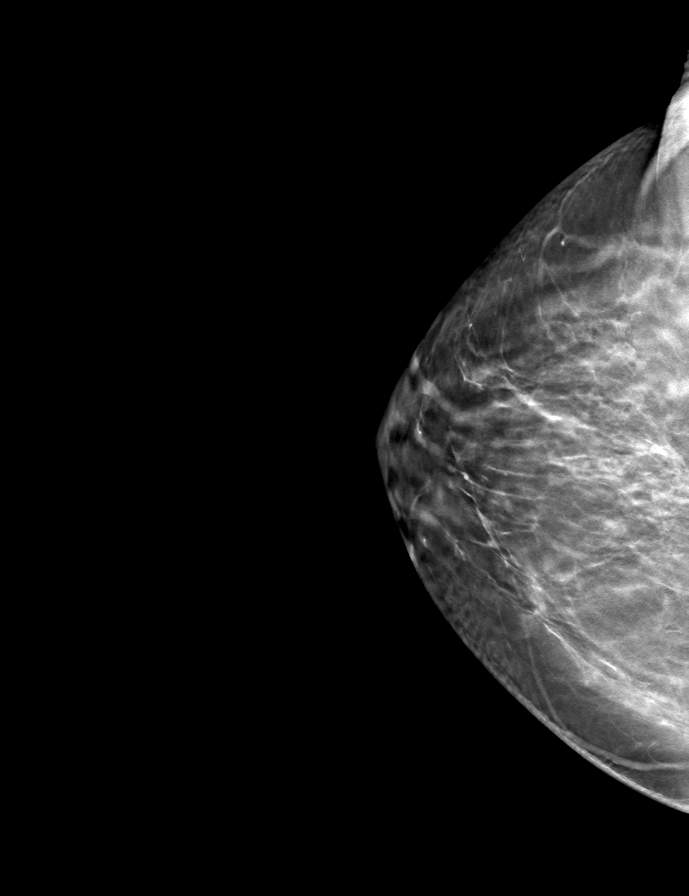

[R MLO tomo · tomo slice 39/78.0]
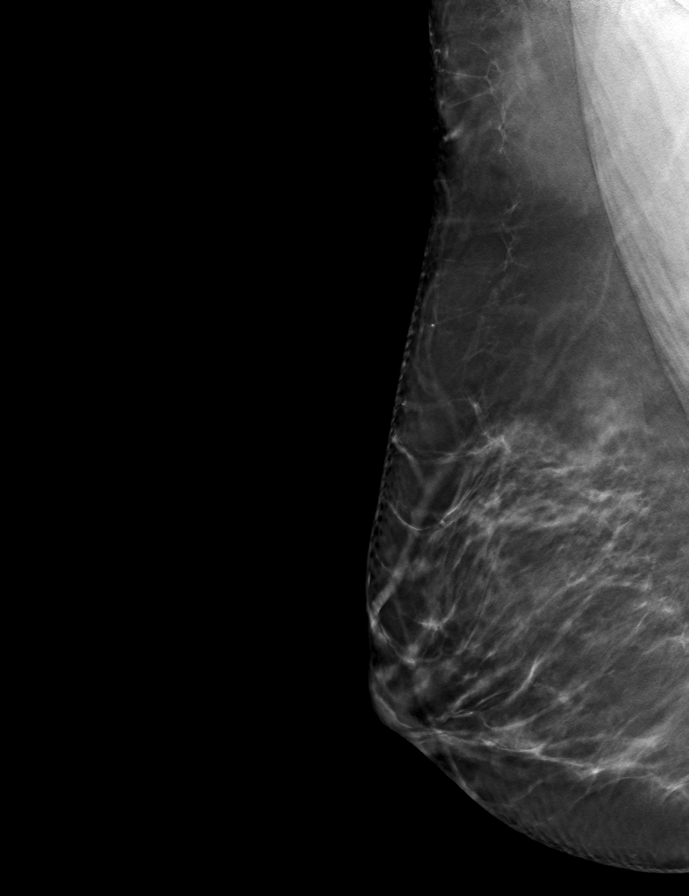

[9 of 24 positions shown; findings below may reference images not displayed]

ACR Breast Density Category b: There are scattered areas of
fibroglandular density.
FINDINGS: There are no findings suspicious for malignancy. Images were
processed with CAD.
IMPRESSION: No mammographic evidence of malignancy. A result letter of this
screening mammogram will be mailed directly to the patient.

RECOMMENDATION:
Screening mammogram in one year. (Code:[id number])

BI-RADS CATEGORY  1: Negative.

## 2021-08-30 ENCOUNTER — Ambulatory Visit
Admission: RE | Admit: 2021-08-30 | Discharge: 2021-08-30 | Disposition: A | Discharge status: Home / Self Care | Payer: BC Managed Care – PPO | Source: Ambulatory Visit | Attending: Family Medicine | Admitting: Family Medicine

## 2021-08-30 ENCOUNTER — Ambulatory Visit: Payer: BC Managed Care – PPO

## 2021-08-30 DIAGNOSIS — N6489 Other specified disorders of breast: Secondary | ICD-10-CM

## 2022-01-24 ENCOUNTER — Other Ambulatory Visit: Payer: Self-pay | Admitting: Family Medicine

## 2022-01-24 DIAGNOSIS — N6489 Other specified disorders of breast: Secondary | ICD-10-CM

## 2022-03-10 ENCOUNTER — Ambulatory Visit
Admission: RE | Admit: 2022-03-10 | Discharge: 2022-03-10 | Disposition: A | Discharge status: Home / Self Care | Payer: BC Managed Care – PPO | Source: Ambulatory Visit | Attending: Family Medicine | Admitting: Family Medicine

## 2022-03-10 DIAGNOSIS — N6489 Other specified disorders of breast: Secondary | ICD-10-CM

## 2024-02-13 ENCOUNTER — Other Ambulatory Visit: Payer: Self-pay | Admitting: Family Medicine

## 2024-02-13 DIAGNOSIS — N6489 Other specified disorders of breast: Secondary | ICD-10-CM

## 2024-02-20 ENCOUNTER — Ambulatory Visit
Admission: RE | Admit: 2024-02-20 | Discharge: 2024-02-20 | Disposition: A | Discharge status: Home / Self Care | Source: Ambulatory Visit | Attending: Family Medicine | Admitting: Family Medicine

## 2024-02-20 DIAGNOSIS — N6489 Other specified disorders of breast: Secondary | ICD-10-CM
